# Patient Record
Sex: Male | Born: 1945 | Race: Black or African American | Hispanic: No | Smoking: Former smoker
Health system: Southern US, Community
[De-identification: ages and names within clinical notes are randomized; demographics above are authoritative.]

## PROBLEM LIST (undated history)

## (undated) DIAGNOSIS — N529 Male erectile dysfunction, unspecified: Secondary | ICD-10-CM

## (undated) DIAGNOSIS — J449 Chronic obstructive pulmonary disease, unspecified: Secondary | ICD-10-CM

## (undated) HISTORY — PX: KNEE SURGERY: SHX244

---

## 2005-12-02 ENCOUNTER — Emergency Department: Payer: Self-pay | Admitting: Internal Medicine

## 2008-06-06 ENCOUNTER — Emergency Department: Payer: Self-pay | Admitting: Emergency Medicine

## 2011-08-02 ENCOUNTER — Emergency Department: Payer: Self-pay | Admitting: Emergency Medicine

## 2011-08-02 LAB — BASIC METABOLIC PANEL
Anion Gap: 15 (ref 7–16)
Calcium, Total: 9.5 mg/dL (ref 8.5–10.1)
Creatinine: 1.08 mg/dL (ref 0.60–1.30)
EGFR (Non-African Amer.): >60
Glucose: 135 mg/dL — ABNORMAL HIGH (ref 65–99)
Osmolality: 276 (ref 275–301)
Potassium: 4.2 mmol/L (ref 3.5–5.1)
Sodium: 136 mmol/L (ref 136–145)

## 2011-08-02 LAB — URINALYSIS, COMPLETE
Bacteria: NONE SEEN
Bilirubin,UR: NEGATIVE
Blood: NEGATIVE
Nitrite: NEGATIVE
Protein: 30
RBC,UR: 1 /HPF (ref 0–5)
Specific Gravity: 1.026 (ref 1.003–1.030)
WBC UR: 8 /HPF (ref 0–5)

## 2011-08-02 LAB — CBC
HCT: 47.6 % (ref 40.0–52.0)
HGB: 15.5 g/dL (ref 13.0–18.0)
MCHC: 32.5 g/dL (ref 32.0–36.0)

## 2013-10-16 ENCOUNTER — Emergency Department: Payer: Self-pay | Admitting: Emergency Medicine

## 2013-11-01 ENCOUNTER — Emergency Department: Payer: Self-pay | Admitting: Emergency Medicine

## 2014-07-02 ENCOUNTER — Emergency Department: Payer: Self-pay | Admitting: Emergency Medicine

## 2016-02-25 ENCOUNTER — Emergency Department: Payer: Medicare Other

## 2016-02-25 ENCOUNTER — Emergency Department
Admission: EM | Admit: 2016-02-25 | Discharge: 2016-02-25 | Disposition: A | Discharge status: Home / Self Care | Payer: Medicare Other | Attending: Emergency Medicine | Admitting: Emergency Medicine

## 2016-02-25 ENCOUNTER — Encounter: Payer: Self-pay | Admitting: Emergency Medicine

## 2016-02-25 DIAGNOSIS — Z87891 Personal history of nicotine dependence: Secondary | ICD-10-CM | POA: Diagnosis not present

## 2016-02-25 DIAGNOSIS — J441 Chronic obstructive pulmonary disease with (acute) exacerbation: Secondary | ICD-10-CM | POA: Diagnosis not present

## 2016-02-25 DIAGNOSIS — R0602 Shortness of breath: Secondary | ICD-10-CM | POA: Diagnosis present

## 2016-02-25 HISTORY — DX: Chronic obstructive pulmonary disease, unspecified: J44.9

## 2016-02-25 HISTORY — DX: Male erectile dysfunction, unspecified: N52.9

## 2016-02-25 LAB — COMPREHENSIVE METABOLIC PANEL
ALT: 11 U/L — AB (ref 17–63)
AST: 34 U/L (ref 15–41)
Albumin: 3.8 g/dL (ref 3.5–5.0)
Alkaline Phosphatase: 51 U/L (ref 38–126)
Anion gap: 7 (ref 5–15)
BILIRUBIN TOTAL: 0.2 mg/dL — AB (ref 0.3–1.2)
BUN: 16 mg/dL (ref 6–20)
CHLORIDE: 104 mmol/L (ref 101–111)
CO2: 29 mmol/L (ref 22–32)
CREATININE: 0.78 mg/dL (ref 0.61–1.24)
Calcium: 9.6 mg/dL (ref 8.9–10.3)
Glucose, Bld: 118 mg/dL — ABNORMAL HIGH (ref 65–99)
POTASSIUM: 3.5 mmol/L (ref 3.5–5.1)
Sodium: 140 mmol/L (ref 135–145)
TOTAL PROTEIN: 7.7 g/dL (ref 6.5–8.1)

## 2016-02-25 LAB — CBC
HEMATOCRIT: 36.1 % — AB (ref 40.0–52.0)
Hemoglobin: 11.3 g/dL — ABNORMAL LOW (ref 13.0–18.0)
MCH: 24.9 pg — AB (ref 26.0–34.0)
MCHC: 31.3 g/dL — ABNORMAL LOW (ref 32.0–36.0)
MCV: 79.5 fL — AB (ref 80.0–100.0)
PLATELETS: 206 10*3/uL (ref 150–440)
RBC: 4.54 MIL/uL (ref 4.40–5.90)
RDW: 20.7 % — AB (ref 11.5–14.5)
WBC: 6.2 10*3/uL (ref 3.8–10.6)

## 2016-02-25 LAB — TROPONIN I

## 2016-02-25 MED ORDER — IPRATROPIUM-ALBUTEROL 0.5-2.5 (3) MG/3ML IN SOLN
3.0000 mL | Freq: Once | RESPIRATORY_TRACT | Status: AC
  Administered 2016-02-25: 3 mL via RESPIRATORY_TRACT
  Filled 2016-02-25: qty 3

## 2016-02-25 MED ORDER — PREDNISONE 50 MG PO TABS: 50.0000 mg | ORAL_TABLET | Freq: Every day | ORAL | 0 refills | Status: DC

## 2016-02-25 MED ORDER — METHYLPREDNISOLONE SODIUM SUCC 125 MG IJ SOLR
125.0000 mg | Freq: Once | INTRAMUSCULAR | Status: AC
  Administered 2016-02-25: 125 mg via INTRAVENOUS
  Filled 2016-02-25: qty 2

## 2016-02-25 NOTE — ED Provider Notes (Signed)
[facility] Emergency Department Provider Note   ____________________________________________    I have reviewed the triage vital signs and the nursing notes.   HISTORY  Chief Complaint Shortness of Breath and Wheezing     HPI [name] [name] is a 70 y.o. male who presents with complaints of shortness of breath. Patient reports his breathing has worsened over the last several days, he does report a history of COPD. EMS reports he had significant wheezing upon arrival and they gave a DuoNeb which helped tremendously. Patient reports he feels much better. He denies chest pain. No palpations. No recent travel or calf pain. No fevers or chills.   Past Medical History:  Diagnosis Date  . COPD (chronic obstructive pulmonary disease) (HCC)   . Male erectile disorder     There are no active problems to display for this patient.   No past surgical history on file.  Prior to Admission medications   Not on File     Allergies Patient has no known allergies.  No family history on file.  Social History Social History  Substance Use Topics  . Smoking status: Former Games developermoker  . Smokeless tobacco: Never Used  . Alcohol use No    Review of Systems  Constitutional: No fever/chills  ENT: No Throat swelling Cardiovascular: Denies chest pain. Respiratory: As above Gastrointestinal: No abdominal pain.    Musculoskeletal: Negative for back pain. Skin: Negative for rash. Neurological: Negative for headaches  10-point ROS otherwise negative.  ____________________________________________   PHYSICAL EXAM:  VITAL SIGNS: ED Triage Vitals  Enc Vitals Group     BP 02/25/16 1950 122/75     Pulse Rate 02/25/16 1950 91     Resp 02/25/16 1950 (!) 23     Temp --      Temp Source 02/25/16 1950 Oral     SpO2 02/25/16 1950 100 %     Weight 02/25/16 1952 126 lb (57.2 kg)     Height 02/25/16 1952 5\' 8"  (1.727 m)     Head Circumference --      Peak  Flow --      Pain Score --      Pain Loc --      Pain Edu? --      Excl. in GC? --     Constitutional: Alert and oriented. No acute distress. Pleasant and interactive Eyes: Conjunctivae are normal.  Head: Atraumatic. Nose: No congestion/rhinnorhea. Mouth/Throat: Mucous membranes are moist.    Cardiovascular: Normal rate, regular rhythm. Grossly normal heart sounds.  Good peripheral circulation. Respiratory: Increased history effort.  No retractions. No wheezes Gastrointestinal: Soft and nontender. No distention.  No CVA tenderness. Genitourinary: deferred Musculoskeletal: No lower extremity tenderness nor edema.  Warm and well perfused Neurologic:  Normal speech and language. No gross focal neurologic deficits are appreciated.  Skin:  Skin is warm, dry and intact. No rash noted. Psychiatric: Mood and affect are normal. Speech and behavior are normal.  ____________________________________________   LABS (all labs ordered are listed, but only abnormal results are displayed)  Labs Reviewed  CBC - Abnormal; Notable for the following:       Result Value   Hemoglobin 11.3 (*)    HCT 36.1 (*)    MCV 79.5 (*)    MCH 24.9 (*)    MCHC 31.3 (*)    RDW 20.7 (*)    All other components within normal limits  COMPREHENSIVE METABOLIC PANEL  TROPONIN I   ____________________________________________  EKG  ED ECG REPORT I, Jene EveryKINNER, Sylis Ketchum, the attending physician, personally viewed and interpreted this ECG.  Date: 02/25/2016  Rhythm: A flutter QRS Axis: normal Intervals: normal ST/T Wave abnormalities: normal Conduction Disturbances: none Narrative Interpretation: unremarkable  ____________________________________________  RADIOLOGY  Chest x-ray unremarkable ____________________________________________   PROCEDURES  Procedure(s) performed: No    Critical Care performed: No ____________________________________________   INITIAL IMPRESSION / ASSESSMENT AND PLAN /  ED COURSE  Pertinent labs & imaging results that were available during my care of the patient were reviewed by me and considered in my medical decision making (see chart for details).  Patient improved significantly after DuoNeb given by EMS, we we'll give IV site Medrol and additional nebulizers and check labs and EKG and chest x-ray  Clinical Course   ----------------------------------------- 9:57 PM on 02/25/2016 -----------------------------------------  Patient is asymptomatic and feels "great". Lungs are clear to auscultation, laboratory unremarkable. Incidental finding of a flutter on EKG patient to [facility], we'll discharge with prednisone, return precautions discussed ____________________________________________   FINAL CLINICAL IMPRESSION(S) / ED DIAGNOSES  Final diagnoses:  COPD exacerbation (HCC)      NEW MEDICATIONS STARTED DURING THIS VISIT:  New Prescriptions   No medications on file     Note:  This document was prepared using Dragon voice recognition software and may include unintentional dictation errors.    Jene Everyobert Jonuel Butterfield, MD 02/25/16 2158

## 2016-02-25 NOTE — ED Triage Notes (Signed)
Pt presents to ED from his daughters house by EMS with c/o sob, wheezing, and occasional cough. Pt states they [facility] at [facility] and was told he didn't need them anymore and he has felt sob since then. Duoneb provided by EMS . Initial O2 SAT was 91%; 100% post breathing tx. Pt currently has no increased work of breathing noted. Skin warm and dry. Denies chest pain. Hx of COPD.

## 2016-05-22 ENCOUNTER — Emergency Department
Admission: EM | Admit: 2016-05-22 | Discharge: 2016-05-22 | Disposition: A | Discharge status: Home / Self Care | Payer: Medicare Other | Attending: Emergency Medicine | Admitting: Emergency Medicine

## 2016-05-22 ENCOUNTER — Emergency Department: Payer: Medicare Other

## 2016-05-22 ENCOUNTER — Encounter: Payer: Self-pay | Admitting: Emergency Medicine

## 2016-05-22 DIAGNOSIS — Z87891 Personal history of nicotine dependence: Secondary | ICD-10-CM | POA: Insufficient documentation

## 2016-05-22 DIAGNOSIS — R062 Wheezing: Secondary | ICD-10-CM | POA: Diagnosis present

## 2016-05-22 DIAGNOSIS — J441 Chronic obstructive pulmonary disease with (acute) exacerbation: Secondary | ICD-10-CM

## 2016-05-22 LAB — CBC WITH DIFFERENTIAL/PLATELET
BASOS ABS: 0.1 10*3/uL (ref 0–0.1)
BASOS PCT: 1 %
EOS ABS: 0.1 10*3/uL (ref 0–0.7)
EOS PCT: 1 %
HCT: 30.6 % — ABNORMAL LOW (ref 40.0–52.0)
Hemoglobin: 9.5 g/dL — ABNORMAL LOW (ref 13.0–18.0)
LYMPHS PCT: 25 %
Lymphs Abs: 2 10*3/uL (ref 1.0–3.6)
MCH: 24 pg — ABNORMAL LOW (ref 26.0–34.0)
MCHC: 30.9 g/dL — ABNORMAL LOW (ref 32.0–36.0)
MCV: 77.7 fL — ABNORMAL LOW (ref 80.0–100.0)
MONO ABS: 0.8 10*3/uL (ref 0.2–1.0)
Monocytes Relative: 11 %
Neutro Abs: 4.8 10*3/uL (ref 1.4–6.5)
Neutrophils Relative %: 62 %
PLATELETS: 431 10*3/uL (ref 150–440)
RBC: 3.94 MIL/uL — AB (ref 4.40–5.90)
RDW: 18.8 % — AB (ref 11.5–14.5)
WBC: 7.7 10*3/uL (ref 3.8–10.6)

## 2016-05-22 LAB — BASIC METABOLIC PANEL
ANION GAP: 7 (ref 5–15)
BUN: 14 mg/dL (ref 6–20)
CALCIUM: 9.9 mg/dL (ref 8.9–10.3)
CO2: 30 mmol/L (ref 22–32)
Chloride: 105 mmol/L (ref 101–111)
Creatinine, Ser: 0.55 mg/dL — ABNORMAL LOW (ref 0.61–1.24)
GFR calc Af Amer: >60 mL/min (ref >60)
Glucose, Bld: 98 mg/dL (ref 65–99)
POTASSIUM: 3.5 mmol/L (ref 3.5–5.1)
SODIUM: 142 mmol/L (ref 135–145)

## 2016-05-22 LAB — INFLUENZA PANEL BY PCR (TYPE A & B)
INFLBPCR: NEGATIVE
Influenza A By PCR: NEGATIVE

## 2016-05-22 LAB — TROPONIN I

## 2016-05-22 MED ORDER — IPRATROPIUM-ALBUTEROL 0.5-2.5 (3) MG/3ML IN SOLN
3.0000 mL | Freq: Once | RESPIRATORY_TRACT | Status: AC
  Administered 2016-05-22: 3 mL via RESPIRATORY_TRACT
  Filled 2016-05-22: qty 3

## 2016-05-22 MED ORDER — PREDNISONE 20 MG PO TABS: 60.0000 mg | ORAL_TABLET | Freq: Every day | ORAL | 0 refills | Status: AC

## 2016-05-22 MED ORDER — ALBUTEROL SULFATE HFA 108 (90 BASE) MCG/ACT IN AERS: 2.0000 | INHALATION_SPRAY | Freq: Four times a day (QID) | RESPIRATORY_TRACT | 2 refills | Status: DC | PRN

## 2016-05-22 MED ORDER — METHYLPREDNISOLONE SODIUM SUCC 125 MG IJ SOLR
125.0000 mg | Freq: Once | INTRAMUSCULAR | Status: AC
  Administered 2016-05-22: 125 mg via INTRAVENOUS
  Filled 2016-05-22: qty 2

## 2016-05-22 NOTE — ED Provider Notes (Signed)
[facility] Emergency Department Provider Note  ____________________________________________  Time seen: Approximately 6:02 PM  I have reviewed the triage vital signs and the nursing notes.   HISTORY  Chief Complaint Wheezing   HPI [name] [name] is a 71 y.o. male the history of COPD who presents for evaluation of wheezing. Patient reports for the last few days he has had URI symptoms including cough productive of yellow sputum, shortness of breath, wheezing, fever, chills. Today his landlord went to see him and noticed that he was having difficulty breathing so called the ambulance. He was wheezing per EMS and received 1 DuoNeb treatment in route. Patient reports that he hasn't been using his inhalers because he cannot afford it. He is no longer smoking. Patient denies chest pain, vomiting or diarrhea. He reports that his shortness of breath is now markedly improved after receiving 1 DuoNeb treatment.  Past Medical History:  Diagnosis Date  . COPD (chronic obstructive pulmonary disease) (HCC)   . Male erectile disorder     There are no active problems to display for this patient.   History reviewed. No pertinent surgical history.  Prior to Admission medications   Medication Sig Start Date End Date Taking? Authorizing Provider  albuterol (PROVENTIL HFA;VENTOLIN HFA) 108 (90 Base) MCG/ACT inhaler Inhale 2 puffs into the lungs every 6 (six) hours as needed for wheezing or shortness of breath. 05/22/16   Nita Sickle, MD  predniSONE (DELTASONE) 20 MG tablet Take 3 tablets (60 mg total) by mouth daily. 05/22/16 05/26/16  Nita Sickle, MD    Allergies Patient has no known allergies.  History reviewed. No pertinent family history.  Social History Social History  Substance Use Topics  . Smoking status: Former Games developer  . Smokeless tobacco: Never Used  . Alcohol use No    Review of Systems  Constitutional: +fever and chills Eyes: Negative for  visual changes. ENT: Negative for sore throat. Neck: No neck pain  Cardiovascular: Negative for chest pain. Respiratory: + shortness of breath, cough and wheezing Gastrointestinal: Negative for abdominal pain, vomiting or diarrhea. Genitourinary: Negative for dysuria. Musculoskeletal: Negative for back pain. Skin: Negative for rash. Neurological: Negative for headaches, weakness or numbness. Psych: No SI or HI  ____________________________________________   PHYSICAL EXAM:  VITAL SIGNS: ED Triage Vitals  Enc Vitals Group     BP 05/22/16 1710 (!) 149/109     Pulse Rate 05/22/16 1710 96     Resp 05/22/16 1710 18     Temp 05/22/16 1710 97.7 F (36.5 C)     Temp Source 05/22/16 1710 Oral     SpO2 05/22/16 1710 99 %     Weight 05/22/16 1714 128 lb (58.1 kg)     Height 05/22/16 1714 5\' 8"  (1.727 m)     Head Circumference --      Peak Flow --      Pain Score --      Pain Loc --      Pain Edu? --      Excl. in GC? --     Constitutional: Alert and oriented. Well appearing and in no apparent distress. HEENT:      Head: Normocephalic and atraumatic.         Eyes: Conjunctivae are normal. Sclera is non-icteric. EOMI. PERRL      Mouth/Throat: Mucous membranes are moist.       Neck: Supple with no signs of meningismus. Cardiovascular: Regular rate and rhythm. No murmurs, gallops, or rubs. 2+ symmetrical  distal pulses are present in all extremities. No JVD. Respiratory: Normal respiratory effort, normal sats, decreased air movement bilaterally with faint expiratory wheeze Gastrointestinal: Soft, non tender, and non distended with positive bowel sounds. No rebound or guarding. Musculoskeletal: Nontender with normal range of motion in all extremities. No edema, cyanosis, or erythema of extremities. Neurologic: Normal speech and language. Face is symmetric. Moving all extremities. No gross focal neurologic deficits are appreciated. Skin: Skin is warm, dry and intact. No rash  noted. Psychiatric: Mood and affect are normal. Speech and behavior are normal.  ____________________________________________   LABS (all labs ordered are listed, but only abnormal results are displayed)  Labs Reviewed  CBC WITH DIFFERENTIAL/PLATELET - Abnormal; Notable for the following:       Result Value   RBC 3.94 (*)    Hemoglobin 9.5 (*)    HCT 30.6 (*)    MCV 77.7 (*)    MCH 24.0 (*)    MCHC 30.9 (*)    RDW 18.8 (*)    All other components within normal limits  BASIC METABOLIC PANEL - Abnormal; Notable for the following:    Creatinine, Ser 0.55 (*)    All other components within normal limits  TROPONIN I  INFLUENZA PANEL BY PCR (TYPE A & B)   ____________________________________________  EKG  ED ECG REPORT I, Nita Sicklearolina Etheline Geppert, the attending physician, personally viewed and interpreted this ECG.  Normal sinus rhythm, rate of 79, normal intervals, right axis deviation, no ST elevations or depressions.  ____________________________________________  RADIOLOGY  CXR: Negative ____________________________________________   PROCEDURES  Procedure(s) performed: None Procedures Critical Care performed:  None ____________________________________________   INITIAL IMPRESSION / ASSESSMENT AND PLAN / ED COURSE  71 y.o. male the history of COPD who presents for evaluation of wheezing and SOb in the setting of 2 days of URI symptoms. Patient has normal work of breathing, normal sats, decreased air movement with faint expiratory wheezes. We'll give DuoNeb 2, Solu-Medrol, check for the flu, and chest x-ray to rule out pneumonia.  Clinical Course as of May 22 1944  Sat May 22, 2016  1902 Hgb dropped to 2 points since 02/2016 and is currently 9.5 with low MCV concerning for possible iron deficiency anemia. Rectal exam showing brown stool guaiac negative.  [CV]  1925 Flu negative, chest x-ray with no evidence of pneumonia. Blood work but no other acute findings. Patient  feels markedly improved after 2 DuoNeb treatments. We'll discharge home with albuterol, prednisone, and [facility]  [CV]    Clinical Course User Index [CV] Nita Sicklearolina Chalmers Iddings, MD    Pertinent labs & imaging results that were available during my care of the patient were reviewed by me and considered in my medical decision making (see chart for details).    ____________________________________________   FINAL CLINICAL IMPRESSION(S) / ED DIAGNOSES  Final diagnoses:  COPD exacerbation (HCC)      NEW MEDICATIONS STARTED DURING THIS VISIT:  New Prescriptions   ALBUTEROL (PROVENTIL HFA;VENTOLIN HFA) 108 (90 BASE) MCG/ACT INHALER    Inhale 2 puffs into the lungs every 6 (six) hours as needed for wheezing or shortness of breath.   PREDNISONE (DELTASONE) 20 MG TABLET    Take 3 tablets (60 mg total) by mouth daily.     Note:  This document was prepared using Dragon voice recognition software and may include unintentional dictation errors.    Nita Sicklearolina Enijah Furr, MD 05/22/16 2042

## 2016-05-22 NOTE — ED Notes (Signed)
Pt not interested in staying here in the ed to speak with sw. States he has a fireplace at home and has been using that to keep warm. Given a food tray and milk.

## 2016-05-22 NOTE — ED Notes (Signed)
Asked pt again how long heat has been out, since as the pt talking he's been out of a job and no insurance for "awhile". Pt now states no heat x 2 days. Pt angry that he has to pay for electric when he is out of a job.

## 2016-05-22 NOTE — ED Notes (Signed)
Pt d/c'd but has no heat or electricity at home so will be staying overnight in the er until he can talk to sw in the am. Iv d/c'd. Pt off monitor and moved to reciner in the hallway

## 2016-05-22 NOTE — ED Triage Notes (Addendum)
Wheezing - poor historian. Landlord call ems for his wheezing - pt has no phone, no heat. Per ems pt stated without heat x 2 weeks.

## 2016-05-22 NOTE — ED Notes (Signed)
States out of job, so hasn't been using his inhaler because it costs $10.00. [facility]

## 2016-05-22 NOTE — ED Notes (Signed)
Pt declined to stay - spoke with charge nurse and she set up a care package for pt to take back to his house and gave him a cab voucher

## 2016-05-30 ENCOUNTER — Telehealth: Payer: Self-pay | Admitting: Emergency Medicine

## 2016-05-30 NOTE — Telephone Encounter (Signed)
EMS requesting transport form filled out, confirmed by chart documentation that the pt arrived by EMS and evaluated here., formed signed by this RN

## 2016-10-12 ENCOUNTER — Other Ambulatory Visit: Payer: Self-pay

## 2016-11-03 NOTE — Progress Notes (Signed)
11/04/2016 2:10 PM   [name] [name] [birth year] [id number]  Referring provider: [facility], [facility] [address] [place], [place] [address]  Chief Complaint  Patient presents with  . New Patient (Initial Visit)    urinary incontinence / elevated psa referred by [facility] [place])     HPI: Patient is a 71 year old African-American male who is referred by Dr. Judie Grieve for elevated PSA with his peer support counselor, Lacretia Nicks.  Patient is a poor historian and is difficult to understand.    Patient was found to have a PSA of 4.9 in 09/2016.  No previous PSA's available at this time.    BPH WITH LUTS  (prostate and/or bladder) His IPSS score today is 10, which is moderate lower urinary tract symptomatology. He is mostly dissatisfied with his quality life due to his urinary symptoms. His PVR is 80 mL.      His major complaints today are frequency, urgency, nocturia, incontinence and intermittency.  He has these symptoms when he drinks alcohol.  He denies any dysuria, hematuria or suprapubic pain.   He also denies any recent fevers, chills, nausea or vomiting.  He does not have a family history of PCa.      IPSS    Row Name 11/04/16 1300         International Prostate Symptom Score   How often have you had the sensation of not emptying your bladder? Not at All     How often have you had to urinate less than every two hours? Less than half the time     How often have you found you stopped and started again several times when you urinated? Less than half the time     How often have you found it difficult to postpone urination? Less than half the time     How often have you had a weak urinary stream? Not at All     How often have you had to strain to start urination? Not at All     How many times did you typically get up at night to urinate? 4 Times     Total IPSS Score 10       Quality of Life due to urinary symptoms   If you were to spend the rest of your  life with your urinary condition just the way it is now how would you feel about that? Mostly Disatisfied        Score:  1-7 Mild 8-19 Moderate 20-35 Severe   Erectile dysfunction His SHIM score is 25, which is no ED.   He has been having difficulty with erections when he drinks.  His libido is preserved.   His risk factors for ED are age, HTN, alcohol abuse and smoking.  He denies any painful erections or curvatures with his erections.        SHIM    Row Name 11/04/16 1343         SHIM: Over the last 6 months:   How do you rate your confidence that you could get and keep an erection? Very High     When you had erections with sexual stimulation, how often were your erections hard enough for penetration (entering your partner)? Almost Always or Always     During sexual intercourse, how often were you able to maintain your erection after you had penetrated (entered) your partner? Almost Always or Always     During sexual intercourse, how difficult was it to maintain  your erection to completion of intercourse? Not Difficult     When you attempted sexual intercourse, how often was it satisfactory for you? Almost Always or Always       SHIM Total Score   SHIM 25        Score: 1-7 Severe ED 8-11 Moderate ED 12-16 Mild-Moderate ED 17-21 Mild ED 22-25 No ED   PMH: Past Medical History:  Diagnosis Date  . COPD (chronic obstructive pulmonary disease) (HCC)   . Male erectile disorder     Surgical History: Past Surgical History:  Procedure Laterality Date  . KNEE SURGERY Right     Home Medications:  Allergies as of 11/04/2016   No Known Allergies     Medication List       Accurate as of 11/04/16  2:10 PM. Always use your most recent med list.          albuterol 108 (90 Base) MCG/ACT inhaler Commonly known as:  PROVENTIL HFA;VENTOLIN HFA Inhale 2 puffs into the lungs every 6 (six) hours as needed for wheezing or shortness of breath.       Allergies: No Known  Allergies  Family History: No family history on file.  Social History:  reports that he has quit smoking. He has never used smokeless tobacco. He reports that he does not drink alcohol or use drugs.  ROS: UROLOGY Frequent Urination?: Yes Hard to postpone urination?: Yes Burning/pain with urination?: No Get up at night to urinate?: Yes Leakage of urine?: Yes Urine stream starts and stops?: Yes Trouble starting stream?: No Do you have to strain to urinate?: No Blood in urine?: Yes Urinary tract infection?: No Sexually transmitted disease?: No Injury to kidneys or bladder?: No Painful intercourse?: No Weak stream?: No Erection problems?: No Penile pain?: No  Gastrointestinal Nausea?: No Vomiting?: No Indigestion/heartburn?: No Diarrhea?: No Constipation?: Yes  Constitutional Fever: Yes Night sweats?: Yes Weight loss?: Yes Fatigue?: No  Skin Skin rash/lesions?: No Itching?: Yes  Eyes Blurred vision?: Yes Double vision?: No  Ears/Nose/Throat Sore throat?: Yes Sinus problems?: No  Hematologic/Lymphatic Swollen glands?: Yes Easy bruising?: Yes  Cardiovascular Leg swelling?: Yes Chest pain?: No  Respiratory Cough?: Yes Shortness of breath?: Yes  Endocrine Excessive thirst?: Yes  Musculoskeletal Back pain?: Yes Joint pain?: Yes  Neurological Headaches?: Yes Dizziness?: Yes  Psychologic Depression?: Yes Anxiety?: Yes  Physical Exam: BP (!) 149/79   Pulse 60   Ht 5\' 8"  (1.727 m)   Wt 132 lb (59.9 kg)   BMI 20.07 kg/m   Constitutional: Well nourished. Alert and oriented, No acute distress. HEENT: [place] AT, moist mucus membranes. Trachea midline, no masses. Cardiovascular: No clubbing, cyanosis, or edema. Respiratory: Normal respiratory effort, no increased work of breathing. Skin: No rashes, bruises or suspicious lesions. Lymph: No cervical or inguinal adenopathy. Neurologic: Grossly intact, no focal deficits, moving all 4  extremities. Psychiatric: Normal mood and affect.  Laboratory Data: Lab Results  Component Value Date   WBC 7.7 05/22/2016   HGB 9.5 (L) 05/22/2016   HCT 30.6 (L) 05/22/2016   MCV 77.7 (L) 05/22/2016   PLT 431 05/22/2016    Lab Results  Component Value Date   CREATININE 0.55 (L) 05/22/2016    Lab Results  Component Value Date   AST 34 02/25/2016   Lab Results  Component Value Date   ALT 11 (L) 02/25/2016   I have independently reviewed the labs.  Pertinent Imaging: Results for [name] MenghiniJACKSON, [name] [name] (MRN [id number]) as of 11/04/2016 14:29  Ref.  Range 11/04/2016 14:01  Scan Result Unknown 80   I have independently reviewed the films.    Assessment & Plan:    1. Elevated PSA  - I discussed with the patient that PSA is an acronym for  prostate specific antigen,  which is a protein made by the prostate gland and can be detected in the blood stream. I explained to the patient situations that would increase the PSA, such as: a man's age,  BPH, infection, recent intercourse/ejaculation, prostate infarction, recent urethroscopic manipulation (Foley placement/cystoscopy) and prostate cancer.   - We discussed that indications for prostate biopsy are defined by age and race specific PSA cutoffs as well as a PSA velocity of 0.75/year - 64 to 70 years of age the range is 0 to 6.5             - I discussed the AUA Guideline's (2013) for men aged 70+ years or any man with less than a 10 to 15 year life expectancy that screening is not recommended.  If the individual is in excellent health and after discussion it is decided to do a screening PSA, the threshold for biopsy should be raised to 10 ng/mL and if the PSA returns below 3 ng/mL, discontinue screening.             - I did explain that at his age and with his co-morbid conditions, if prostate cancer is found his treatment would be palliative and not curative             - His questions where answered and he voiced his understanding              - He will RTC for a male exam  2. Incontinence  - patient is managing with incontinence pads - states he has no issues when he does not drink alcohol   Return for reschedule with a male provider for exam.  These notes generated with voice recognition software. I apologize for typographical errors.  Michiel Cowboy, PA-C  [facility] [address], [address] [place], [place] [address] [phone]

## 2016-11-04 ENCOUNTER — Telehealth: Payer: Self-pay | Admitting: Urology

## 2016-11-04 ENCOUNTER — Encounter: Payer: Self-pay | Admitting: Urology

## 2016-11-04 ENCOUNTER — Ambulatory Visit (INDEPENDENT_AMBULATORY_CARE_PROVIDER_SITE_OTHER): Payer: Medicare Other | Admitting: Urology

## 2016-11-04 VITALS — BP 149/79 | HR 60 | Ht 68.0 in | Wt 132.0 lb

## 2016-11-04 DIAGNOSIS — R972 Elevated prostate specific antigen [PSA]: Secondary | ICD-10-CM | POA: Diagnosis not present

## 2016-11-04 DIAGNOSIS — N39498 Other specified urinary incontinence: Secondary | ICD-10-CM | POA: Diagnosis not present

## 2016-11-04 LAB — BLADDER SCAN AMB NON-IMAGING: Scan Result: 80

## 2016-11-04 NOTE — Telephone Encounter (Signed)
Please send my note to [facility].

## 2016-11-04 NOTE — Telephone Encounter (Signed)
done

## 2016-11-23 ENCOUNTER — Ambulatory Visit: Payer: Medicaid Other | Admitting: Urology

## 2017-02-08 ENCOUNTER — Inpatient Hospital Stay: Payer: Medicare Other

## 2017-02-08 ENCOUNTER — Encounter: Payer: Self-pay | Admitting: Emergency Medicine

## 2017-02-08 ENCOUNTER — Emergency Department: Payer: Medicare Other

## 2017-02-08 ENCOUNTER — Inpatient Hospital Stay
Admission: EM | Admit: 2017-02-08 | Discharge: 2017-02-11 | DRG: 871 | Disposition: A | Discharge status: Skilled Nursing Facility | Payer: Medicare Other | Attending: Specialist | Admitting: Specialist

## 2017-02-08 DIAGNOSIS — J449 Chronic obstructive pulmonary disease, unspecified: Secondary | ICD-10-CM | POA: Diagnosis present

## 2017-02-08 DIAGNOSIS — R627 Adult failure to thrive: Secondary | ICD-10-CM | POA: Diagnosis present

## 2017-02-08 DIAGNOSIS — N390 Urinary tract infection, site not specified: Secondary | ICD-10-CM

## 2017-02-08 DIAGNOSIS — G9341 Metabolic encephalopathy: Secondary | ICD-10-CM | POA: Diagnosis present

## 2017-02-08 DIAGNOSIS — F1020 Alcohol dependence, uncomplicated: Secondary | ICD-10-CM | POA: Diagnosis present

## 2017-02-08 DIAGNOSIS — Z8249 Family history of ischemic heart disease and other diseases of the circulatory system: Secondary | ICD-10-CM | POA: Diagnosis not present

## 2017-02-08 DIAGNOSIS — E872 Acidosis: Secondary | ICD-10-CM | POA: Diagnosis present

## 2017-02-08 DIAGNOSIS — N3000 Acute cystitis without hematuria: Secondary | ICD-10-CM

## 2017-02-08 DIAGNOSIS — Z681 Body mass index (BMI) 19 or less, adult: Secondary | ICD-10-CM

## 2017-02-08 DIAGNOSIS — E860 Dehydration: Secondary | ICD-10-CM | POA: Diagnosis present

## 2017-02-08 DIAGNOSIS — R6520 Severe sepsis without septic shock: Secondary | ICD-10-CM

## 2017-02-08 DIAGNOSIS — J441 Chronic obstructive pulmonary disease with (acute) exacerbation: Secondary | ICD-10-CM

## 2017-02-08 DIAGNOSIS — F17210 Nicotine dependence, cigarettes, uncomplicated: Secondary | ICD-10-CM | POA: Diagnosis present

## 2017-02-08 DIAGNOSIS — I10 Essential (primary) hypertension: Secondary | ICD-10-CM | POA: Diagnosis present

## 2017-02-08 DIAGNOSIS — A419 Sepsis, unspecified organism: Secondary | ICD-10-CM | POA: Diagnosis not present

## 2017-02-08 DIAGNOSIS — E43 Unspecified severe protein-calorie malnutrition: Secondary | ICD-10-CM | POA: Diagnosis present

## 2017-02-08 DIAGNOSIS — E512 Wernicke's encephalopathy: Secondary | ICD-10-CM | POA: Diagnosis present

## 2017-02-08 DIAGNOSIS — E8729 Other acidosis: Secondary | ICD-10-CM

## 2017-02-08 DIAGNOSIS — B961 Klebsiella pneumoniae [K. pneumoniae] as the cause of diseases classified elsewhere: Secondary | ICD-10-CM | POA: Diagnosis present

## 2017-02-08 DIAGNOSIS — R0602 Shortness of breath: Secondary | ICD-10-CM | POA: Diagnosis present

## 2017-02-08 DIAGNOSIS — R410 Disorientation, unspecified: Secondary | ICD-10-CM

## 2017-02-08 DIAGNOSIS — S0590XA Unspecified injury of unspecified eye and orbit, initial encounter: Secondary | ICD-10-CM

## 2017-02-08 HISTORY — DX: Severe sepsis without septic shock: R65.20

## 2017-02-08 HISTORY — DX: Sepsis, unspecified organism: A41.9

## 2017-02-08 LAB — AMMONIA: AMMONIA: 9 umol/L (ref 9–35)

## 2017-02-08 LAB — URINALYSIS, COMPLETE (UACMP) WITH MICROSCOPIC
Glucose, UA: 50 mg/dL — AB
KETONES UR: 20 mg/dL — AB
Leukocytes, UA: NEGATIVE
Nitrite: NEGATIVE
Specific Gravity, Urine: 1.027 (ref 1.005–1.030)
pH: 5 (ref 5.0–8.0)

## 2017-02-08 LAB — URINE DRUG SCREEN, QUALITATIVE (ARMC ONLY)
Amphetamines, Ur Screen: NEGATIVE — AB
BARBITURATES, UR SCREEN: NEGATIVE — AB
Benzodiazepine, Ur Scrn: NEGATIVE — AB
CANNABINOID 50 NG, UR ~~LOC~~: NEGATIVE — AB
Cocaine Metabolite,Ur ~~LOC~~: NEGATIVE — AB
MDMA (ECSTASY) UR SCREEN: NEGATIVE — AB
Methadone Scn, Ur: NEGATIVE — AB
Opiate, Ur Screen: NEGATIVE — AB
Phencyclidine (PCP) Ur S: NEGATIVE — AB
TRICYCLIC, UR SCREEN: NEGATIVE — AB

## 2017-02-08 LAB — CBC WITH DIFFERENTIAL/PLATELET
BASOS ABS: 0.1 10*3/uL (ref 0–0.1)
BASOS PCT: 0 %
EOS ABS: 0 10*3/uL (ref 0–0.7)
Eosinophils Relative: 0 %
HCT: 43.9 % (ref 40.0–52.0)
HEMOGLOBIN: 14 g/dL (ref 13.0–18.0)
Lymphocytes Relative: 10 %
Lymphs Abs: 1.4 10*3/uL (ref 1.0–3.6)
MCH: 27.5 pg (ref 26.0–34.0)
MCHC: 32 g/dL (ref 32.0–36.0)
MCV: 86 fL (ref 80.0–100.0)
Monocytes Absolute: 1.5 10*3/uL — ABNORMAL HIGH (ref 0.2–1.0)
Monocytes Relative: 11 %
NEUTROS PCT: 79 %
Neutro Abs: 11 10*3/uL — ABNORMAL HIGH (ref 1.4–6.5)
Platelets: 200 10*3/uL (ref 150–440)
RBC: 5.11 MIL/uL (ref 4.40–5.90)
RDW: 17.2 % — ABNORMAL HIGH (ref 11.5–14.5)
WBC: 14 10*3/uL — AB (ref 3.8–10.6)

## 2017-02-08 LAB — BASIC METABOLIC PANEL
Anion gap: 18 — ABNORMAL HIGH (ref 5–15)
BUN: 18 mg/dL (ref 6–20)
CHLORIDE: 94 mmol/L — AB (ref 101–111)
CO2: 25 mmol/L (ref 22–32)
CREATININE: 1.01 mg/dL (ref 0.61–1.24)
Calcium: 9.5 mg/dL (ref 8.9–10.3)
GFR calc non Af Amer: >60 mL/min (ref >60)
Glucose, Bld: 111 mg/dL — ABNORMAL HIGH (ref 65–99)
POTASSIUM: 3.7 mmol/L (ref 3.5–5.1)
SODIUM: 137 mmol/L (ref 135–145)

## 2017-02-08 LAB — TROPONIN I: Troponin I: 0.03 ng/mL (ref <0.03)

## 2017-02-08 LAB — ETHANOL

## 2017-02-08 LAB — BRAIN NATRIURETIC PEPTIDE: B NATRIURETIC PEPTIDE 5: 54 pg/mL (ref 0.0–100.0)

## 2017-02-08 MED ORDER — THIAMINE HCL 100 MG/ML IJ SOLN
500.0000 mg | Freq: Once | INTRAMUSCULAR | Status: AC
  Administered 2017-02-08: 500 mg via INTRAVENOUS
  Filled 2017-02-08: qty 6

## 2017-02-08 MED ORDER — CEFTRIAXONE SODIUM IN DEXTROSE 20 MG/ML IV SOLN
1.0000 g | Freq: Every day | INTRAVENOUS | Status: DC
  Filled 2017-02-08: qty 50

## 2017-02-08 MED ORDER — THIAMINE HCL 100 MG/ML IJ SOLN
Freq: Once | INTRAVENOUS | Status: AC
  Administered 2017-02-08: 17:00:00 via INTRAVENOUS
  Filled 2017-02-08: qty 1000

## 2017-02-08 MED ORDER — IPRATROPIUM-ALBUTEROL 0.5-2.5 (3) MG/3ML IN SOLN
3.0000 mL | Freq: Once | RESPIRATORY_TRACT | Status: AC
  Administered 2017-02-08: 3 mL via RESPIRATORY_TRACT
  Filled 2017-02-08: qty 3

## 2017-02-08 MED ORDER — MAGNESIUM SULFATE 2 GM/50ML IV SOLN
2.0000 g | Freq: Once | INTRAVENOUS | Status: AC
  Administered 2017-02-08: 2 g via INTRAVENOUS
  Filled 2017-02-08: qty 50

## 2017-02-08 MED ORDER — SODIUM CHLORIDE 0.9 % IV SOLN
INTRAVENOUS | Status: DC
  Administered 2017-02-08 – 2017-02-10 (×3): via INTRAVENOUS

## 2017-02-08 MED ORDER — ADULT MULTIVITAMIN W/MINERALS CH
1.0000 | ORAL_TABLET | Freq: Every day | ORAL | Status: DC
  Administered 2017-02-08 – 2017-02-11 (×4): 1 via ORAL
  Filled 2017-02-08 (×4): qty 1

## 2017-02-08 MED ORDER — METHYLPREDNISOLONE SODIUM SUCC 125 MG IJ SOLR: 125.0000 mg | Freq: Once | INTRAMUSCULAR | Status: DC

## 2017-02-08 MED ORDER — FOLIC ACID 1 MG PO TABS
1.0000 mg | ORAL_TABLET | Freq: Every day | ORAL | Status: DC
  Administered 2017-02-08 – 2017-02-11 (×4): 1 mg via ORAL
  Filled 2017-02-08 (×4): qty 1

## 2017-02-08 MED ORDER — CEFTRIAXONE SODIUM IN DEXTROSE 20 MG/ML IV SOLN
1.0000 g | Freq: Once | INTRAVENOUS | Status: AC
  Administered 2017-02-08: 1 g via INTRAVENOUS
  Filled 2017-02-08: qty 50

## 2017-02-08 MED ORDER — VITAMIN B-1 100 MG PO TABS
100.0000 mg | ORAL_TABLET | Freq: Every day | ORAL | Status: DC
  Administered 2017-02-08 – 2017-02-09 (×2): 100 mg via ORAL
  Filled 2017-02-08 (×2): qty 1

## 2017-02-08 MED ORDER — LORAZEPAM 1 MG PO TABS
1.0000 mg | ORAL_TABLET | Freq: Four times a day (QID) | ORAL | Status: DC | PRN
  Administered 2017-02-11: 1 mg via ORAL
  Filled 2017-02-08: qty 1

## 2017-02-08 MED ORDER — LORAZEPAM 2 MG/ML IJ SOLN
1.0000 mg | Freq: Four times a day (QID) | INTRAMUSCULAR | Status: DC | PRN
  Administered 2017-02-09 – 2017-02-11 (×3): 1 mg via INTRAVENOUS
  Filled 2017-02-08 (×3): qty 1

## 2017-02-08 MED ORDER — THIAMINE HCL 100 MG/ML IJ SOLN
100.0000 mg | Freq: Every day | INTRAMUSCULAR | Status: DC
  Filled 2017-02-08: qty 1

## 2017-02-08 NOTE — ED Notes (Signed)
Pt's soiled diaper changed by this nurse and Lenord FellersYessica, EDT with family's permission.

## 2017-02-08 NOTE — ED Notes (Signed)
Patient transported to CT 

## 2017-02-08 NOTE — ED Provider Notes (Signed)
[facility] Emergency Department Provider Note       Time seen: ----------------------------------------- 2:10 PM on 02/08/2017 -----------------------------------------  Level V caveat: History/ROS limited by altered mental status   I have reviewed the triage vital signs and the nursing notes.   HISTORY   Chief Complaint Shortness of Breath    HPI [name] [name] is a 71 y.o. male with a history of COPD and alcohol use disorder who presents to the ED for shortness of breath. Family reportedly had not seen him in several days. He called EMS for shortness of breath and was found to be very short of breath. He doesn't history of COPD and he received a DuoNeb in route with improvement in his symptoms. According to EMS the house he was living in was cold, there was no food there, only empty beer cans.  Past Medical History:  Diagnosis Date  . COPD (chronic obstructive pulmonary disease) (HCC)   . Male erectile disorder     There are no active problems to display for this patient.   Past Surgical History:  Procedure Laterality Date  . KNEE SURGERY Right     Allergies Patient has no known allergies.  Social History Social History  Substance Use Topics  . Smoking status: Former Games developer  . Smokeless tobacco: Never Used  . Alcohol use No    Review of Systems Positive for shortness of breath  All systems negative/normal/unremarkable except as stated in the HPI  ____________________________________________   PHYSICAL EXAM:  VITAL SIGNS: ED Triage Vitals  Enc Vitals Group     BP 02/08/17 1408 (!) 136/111     Pulse Rate 02/08/17 1408 (!) 108     Resp 02/08/17 1408 16     Temp 02/08/17 1408 98.4 F (36.9 C)     Temp Source 02/08/17 1408 Oral     SpO2 02/08/17 1408 100 %     Weight 02/08/17 1409 119 lb (54 kg)     Height 02/08/17 1409 5\' 8"  (1.727 m)     Head Circumference --      Peak Flow --      Pain Score --      Pain Loc --    Pain Edu? --      Excl. in GC? --     Constitutional: Cachectic, disoriented, no distress Eyes: Conjunctivae are normal. Normal extraocular movements. ENT   Head: Normocephalic and atraumatic.   Nose: No congestion/rhinnorhea.   Mouth/Throat: Mucous membranes are moist.   Neck: No stridor. Cardiovascular: Normal rate, regular rhythm. No murmurs, rubs, or gallops. Respiratory: Normal respiratory effort without tachypnea nor retractions. Diminished breath sounds with mild wheezing Gastrointestinal: Soft and nontender. Normal bowel sounds Musculoskeletal: Nontender with normal range of motion in extremities. No lower extremity tenderness nor edema. Extremities are cool to touch Neurologic:  Normal speech and language. No gross focal neurologic deficits are appreciated.  Skin:  Skin is warm, dry and intact. No rash noted. Psychiatric: Mood and affect are normal. Patient is confused ____________________________________________  EKG: Interpreted by me. Sinus tachycardia with a rate of 107 beats per minute, PVCs, normal axis, normal QT.  ____________________________________________  ED COURSE:  Pertinent labs & imaging results that were available during my care of the patient were reviewed by me and considered in my medical decision making (see chart for details). Patient presents for shortness of breath, we will assess with labs and imaging as indicated. He will likely also need a social work consult.   Procedures  ____________________________________________   LABS (pertinent positives/negatives)  Labs Reviewed  CBC WITH DIFFERENTIAL/PLATELET - Abnormal; Notable for the following:       Result Value   WBC 14.0 (*)    RDW 17.2 (*)    Neutro Abs 11.0 (*)    Monocytes Absolute 1.5 (*)    All other components within normal limits  BASIC METABOLIC PANEL - Abnormal; Notable for the following:    Chloride 94 (*)    Glucose, Bld 111 (*)    Anion gap 18 (*)    All other  components within normal limits  TROPONIN I - Abnormal; Notable for the following:    Troponin I 0.03 (*)    All other components within normal limits  BRAIN NATRIURETIC PEPTIDE  BLOOD GAS, VENOUS  URINE DRUG SCREEN, QUALITATIVE ([facility] ONLY)  ETHANOL  AMMONIA    RADIOLOGY  Chest x-ray IMPRESSION: Emphysema without acute disease. ___________________________________________  DIFFERENTIAL DIAGNOSIS   COPD, pneumonia, anemia, alcohol use disorder, electrolyte abnormality, dehydration   FINAL ASSESSMENT AND PLAN  COPD, alcohol use disorder, confusion  Plan: Patient had presented for shortness of breath. Patients labs are still pending at this time. Patients CT of his head is still pending but his x-ray is unremarkable. There is difficult to discern as to whether his confusion is acute or chronic. Family feels like he is incapable of taking care of himself. I have consult with social work.   Emily FilbertWilliams, Jonathan E, MD   Note: This note was generated in part or whole with voice recognition software. Voice recognition is usually quite accurate but there are transcription errors that can and very often do occur. I apologize for any typographical errors that were not detected and corrected.     Emily FilbertWilliams, Jonathan E, MD 02/08/17 [phone]

## 2017-02-08 NOTE — ED Notes (Signed)
Pt transported to room 108 

## 2017-02-08 NOTE — ED Notes (Signed)
Pt given food tray and water.

## 2017-02-08 NOTE — Clinical Social Work Note (Signed)
CSW received consult for "No food, no heat, only beer and copd." CSW acknowledges consult, but could not assess today. CSW will assess pt tomorrow morning, as he has been admitted inpatient. CSW continuing to follow for discharge planning.   Corlis HoveJeneya Viral Schramm, Theresia MajorsLCSWA, [facility] Clinical Social Worker-ED [phone]

## 2017-02-08 NOTE — ED Notes (Signed)
MRI screening patient °

## 2017-02-08 NOTE — ED Provider Notes (Addendum)
I discussed the case with on-call neurologist Dr. Thad Rangereynolds regarding the possibility of more like a Korsakoff syndrome. The patient is confabulating and is clearly ataxic. He doesn't have ophthalmoplegia but he does have some mild nystagmus. We don't have another clear reason for his altered mental status. Dr. Thad Rangereynolds recommends an MRI of the brain with and without contrast and treatment with thiamine presumptively.  She also recommends a thiamine level prior to treatment.   Merrily Brittleifenbark, Jazmynn Pho, MD 02/08/17 1723   The patient has an elevated anion gap along with a low pH concerning for alcoholic ketoacidosis. He is also altered concerning for an acute Korsakoff syndrome. He additionally has a urinary tract infection. He'll require high-dose IV thiamine, IV antibiotics, inpatient admission.   Merrily Brittleifenbark, Cathlyn Tersigni, MD 02/08/17 1755   Apparently the patient [facility]. [facility] now [facility].   Merrily Brittleifenbark, Amiria Orrison, MD 02/08/17 1831  ----------------------------------------- 8:15 PM on 02/08/2017 -----------------------------------------  The [facility] is on diversion and they've authorized the patient to be admitted to our facility.   Merrily Brittleifenbark, Cheyrl Buley, MD 02/08/17 2015

## 2017-02-08 NOTE — ED Notes (Signed)
Patient transported to X-ray 

## 2017-02-08 NOTE — ED Triage Notes (Signed)
Pt to ED via EMS from home c/o SOB.  EMS states patient was found today by family struggling to breathe.  Patient last seen by family on Friday.  Per EMS unable to obtain oxygen sat on scene.  Given 125mg  solumedrol and a duoneb treatment and 100cc normal saline, patient had wheezes and rails throughout.  Per EMS there was no food in the house, a couple trash cans with empty beer cans, and very cold in the house.  Upon arrival patient states relief in breathing, unlabored, clear and diminished lung sounds.  EMS CBG 167.

## 2017-02-08 NOTE — H&P (Addendum)
[facility] [place] [facility]   PATIENT NAME: [name] [name]    MR#:  [id number]  DATE OF BIRTH:  07-30-1945  DATE OF ADMISSION:  02/08/2017  PRIMARY CARE PHYSICIAN: [facility]   REQUESTING/REFERRING PHYSICIAN: Rifenbark  CHIEF COMPLAINT:   Chief Complaint  Patient presents with  . Shortness of Breath    HISTORY OF PRESENT ILLNESS: [name] [name]  is a 71 y.o. male with a known history of COPD, Alcoholism- have been weak and more confused for last few days, and had urinated on himself so his visitor called EMS. Daughter in room, tells that at baseline, he drinks alcohol daily and also smokes, have some dementia and confusion at baseline. Worse for few days. Noted to have UTI,sepsis and also concern for Wernicke's encephalopathy- ER spoke to Neurology and suggested to have MRI. [facility]  PAST MEDICAL HISTORY:   Past Medical History:  Diagnosis Date  . COPD (chronic obstructive pulmonary disease) (HCC)   . Male erectile disorder     PAST SURGICAL HISTORY: Past Surgical History:  Procedure Laterality Date  . KNEE SURGERY Right     SOCIAL HISTORY:  Social History  Substance Use Topics  . Smoking status: Current Some Day Smoker    Packs/day: 0.25  . Smokeless tobacco: Never Used  . Alcohol use 16.8 oz/week    28 Cans of beer per week    FAMILY HISTORY:  Family History  Problem Relation Age of Onset  . Hypertension Mother     DRUG ALLERGIES: No Known Allergies  REVIEW OF SYSTEMS:   CONSTITUTIONAL: No fever,positive for fatigue or weakness.  EYES: No blurred or double vision.  EARS, NOSE, AND THROAT: No tinnitus or ear pain.  RESPIRATORY: No cough, shortness of breath, wheezing or hemoptysis.  CARDIOVASCULAR: No chest pain, orthopnea, edema.  GASTROINTESTINAL: No nausea, vomiting, diarrhea or abdominal pain.  GENITOURINARY: No dysuria, hematuria.  ENDOCRINE: No polyuria, nocturia,   HEMATOLOGY: No anemia, easy bruising or bleeding SKIN: No rash or lesion. MUSCULOSKELETAL: No joint pain or arthritis.   NEUROLOGIC: No tingling, numbness, weakness.  PSYCHIATRY: No anxiety or depression.   MEDICATIONS AT HOME:  Prior to Admission medications   Medication Sig Start Date End Date Taking? Authorizing Provider  albuterol (PROVENTIL HFA;VENTOLIN HFA) 108 (90 Base) MCG/ACT inhaler Inhale 2 puffs into the lungs every 6 (six) hours as needed for wheezing or shortness of breath. 05/22/16  Yes Don Perking, [place], MD      PHYSICAL EXAMINATION:   VITAL SIGNS: Blood pressure 124/82, pulse 95, temperature 98.4 F (36.9 C), temperature source Oral, resp. rate 14, height 5\' 8"  (1.727 m), weight 54 kg (119 lb), SpO2 94 %.  GENERAL:  71 y.o.-year-old patient lying in the bed with no acute distress.  EYES: Pupils equal, round, reactive to light and accommodation. No scleral icterus. Extraocular muscles intact.  HEENT: Head atraumatic, normocephalic. Oropharynx and nasopharynx clear.  NECK:  Supple, no jugular venous distention. No thyroid enlargement, no tenderness.  LUNGS: Normal breath sounds bilaterally, no wheezing, rales,rhonchi or crepitation. No use of accessory muscles of respiration.  CARDIOVASCULAR: S1, S2 normal. No murmurs, rubs, or gallops.  ABDOMEN: Soft, nontender, nondistended. Bowel sounds present. No organomegaly or mass.  EXTREMITIES: No pedal edema, cyanosis, or clubbing.  NEUROLOGIC: Cranial nerves II through XII are intact. Muscle strength 4-5/5 in all extremities. Sensation intact. Gait not checked.  PSYCHIATRIC: The patient is alert and oriented x  3.  SKIN: No obvious rash, lesion, or ulcer.   LABORATORY PANEL:   CBC  Recent Labs Lab 02/08/17 1410  WBC 14.0*  HGB 14.0  HCT 43.9  PLT 200  MCV 86.0  MCH 27.5  MCHC 32.0  RDW 17.2*  LYMPHSABS 1.4  MONOABS 1.5*  EOSABS 0.0  BASOSABS 0.1    ------------------------------------------------------------------------------------------------------------------  Chemistries   Recent Labs Lab 02/08/17 1410  NA 137  K 3.7  CL 94*  CO2 25  GLUCOSE 111*  BUN 18  CREATININE 1.01  CALCIUM 9.5   ------------------------------------------------------------------------------------------------------------------ estimated creatinine clearance is 52 mL/min (by C-G formula based on SCr of 1.01 mg/dL). ------------------------------------------------------------------------------------------------------------------ No results for input(s): TSH, T4TOTAL, T3FREE, THYROIDAB in the last 72 hours.  Invalid input(s): FREET3   Coagulation profile No results for input(s): INR, PROTIME in the last 168 hours. ------------------------------------------------------------------------------------------------------------------- No results for input(s): DDIMER in the last 72 hours. -------------------------------------------------------------------------------------------------------------------  Cardiac Enzymes  Recent Labs Lab 02/08/17 1410  TROPONINI 0.03*   ------------------------------------------------------------------------------------------------------------------ Invalid input(s): POCBNP  ---------------------------------------------------------------------------------------------------------------  Urinalysis    Component Value Date/Time   COLORURINE AMBER (A) 02/08/2017 1520   APPEARANCEUR CLOUDY (A) 02/08/2017 1520   APPEARANCEUR Hazy 08/02/2011 1539   LABSPEC 1.027 02/08/2017 1520   LABSPEC 1.026 08/02/2011 1539   PHURINE 5.0 02/08/2017 1520   GLUCOSEU 50 (A) 02/08/2017 1520   GLUCOSEU Negative 08/02/2011 1539   HGBUR SMALL (A) 02/08/2017 1520   BILIRUBINUR SMALL (A) 02/08/2017 1520   BILIRUBINUR Negative 08/02/2011 1539   KETONESUR 20 (A) 02/08/2017 1520   PROTEINUR >=300 (A) 02/08/2017 1520   NITRITE NEGATIVE  02/08/2017 1520   LEUKOCYTESUR NEGATIVE 02/08/2017 1520   LEUKOCYTESUR Negative 08/02/2011 1539     RADIOLOGY: Ct Head Wo Contrast  Result Date: 02/08/2017 CLINICAL DATA:  Difficulty breathing, altered level of consciousness EXAM: CT HEAD WITHOUT CONTRAST TECHNIQUE: Contiguous axial images were obtained from the base of the skull through the vertex without intravenous contrast. COMPARISON:  07/03/2014 FINDINGS: Brain: Extensive hypodensity in the bilateral white matter, presumably due to small vessel ischemic changes, similar compared to the prior study. Stable ventricle size. No hemorrhage or mass. Vascular: No hyperdense vessels.  Carotid artery calcifications. Skull: No fracture or suspicious bone lesion Sinuses/Orbits: No acute finding. Other: None IMPRESSION: 1. No CT evidence for acute intracranial abnormality. 2. Marked hypodensity in the bilateral white matter which may be secondary to small vessel disease or other white matter disease. Electronically Signed   By: Jasmine PangKim  Fujinaga M.D.   On: 02/08/2017 15:51   Dg Chest Port 1 View  Result Date: 02/08/2017 CLINICAL DATA:  Shortness of breath today in a patient with COPD. EXAM: PORTABLE CHEST 1 VIEW COMPARISON:  PA and lateral chest 05/22/2016 and 08/02/2011. CT chest 06/06/2008. FINDINGS: The chest is hyperexpanded with attenuation of the pulmonary vasculature. Lungs are clear. No pneumothorax or pleural fluid. No bony abnormality. IMPRESSION: Emphysema without acute disease. Electronically Signed   By: Drusilla Kannerhomas  Dalessio M.D.   On: 02/08/2017 14:41    EKG: Orders placed or performed during the hospital encounter of 02/08/17  . ED EKG  . ED EKG  . EKG 12-Lead  . EKG 12-Lead    IMPRESSION AND PLAN:  * Sepsis    UTI    IV rocephin, IV fluids    Follow cx result.  * Altered mental status   Concern for wernicke's encephalopathy   MRI   Thiamine and folic acid   PT eval.  * Chronic alcoholism   Keep  on CIWA for withdrawal  watch.   * COPD   Albuterol as needed.  * Smoking   Counceleld to Quit for 4 min.  All the records are reviewed and case discussed with ED provider. Management plans discussed with the patient, family and they are in agreement.  CODE STATUS: Code Status History    This patient does not have a recorded code status. Please follow your organizational policy for patients in this situation.     Discussed with Daughter in room.  TOTAL TIME TAKING CARE OF THIS PATIENT: 45 minutes.    Altamese Dilling M.D on 02/08/2017   Between 7am to 6pm - Pager - [phone]  After 6pm go to [email or web address] - password [email or web address]  [facility]  Office  [phone]  CC: Primary care physician; [facility]   Note: This dictation was prepared with Dragon dictation along with smaller phrase technology. Any transcriptional errors that result from this process are unintentional.

## 2017-02-09 ENCOUNTER — Inpatient Hospital Stay: Payer: Medicare Other

## 2017-02-09 LAB — BASIC METABOLIC PANEL
ANION GAP: 7 (ref 5–15)
BUN: 20 mg/dL (ref 6–20)
CO2: 29 mmol/L (ref 22–32)
Calcium: 8.8 mg/dL — ABNORMAL LOW (ref 8.9–10.3)
Chloride: 101 mmol/L (ref 101–111)
Creatinine, Ser: 0.82 mg/dL (ref 0.61–1.24)
GFR calc Af Amer: >60 mL/min (ref >60)
GLUCOSE: 178 mg/dL — AB (ref 65–99)
POTASSIUM: 3.8 mmol/L (ref 3.5–5.1)
Sodium: 137 mmol/L (ref 135–145)

## 2017-02-09 LAB — CBC
HEMATOCRIT: 36.3 % — AB (ref 40.0–52.0)
HEMOGLOBIN: 11.9 g/dL — AB (ref 13.0–18.0)
MCH: 28 pg (ref 26.0–34.0)
MCHC: 32.9 g/dL (ref 32.0–36.0)
MCV: 85.2 fL (ref 80.0–100.0)
Platelets: 178 10*3/uL (ref 150–440)
RBC: 4.26 MIL/uL — ABNORMAL LOW (ref 4.40–5.90)
RDW: 17.1 % — AB (ref 11.5–14.5)
WBC: 12.2 10*3/uL — ABNORMAL HIGH (ref 3.8–10.6)

## 2017-02-09 MED ORDER — BISACODYL 5 MG PO TBEC: 5.0000 mg | DELAYED_RELEASE_TABLET | Freq: Every day | ORAL | Status: DC | PRN

## 2017-02-09 MED ORDER — DOCUSATE SODIUM 100 MG PO CAPS: 100.0000 mg | ORAL_CAPSULE | Freq: Two times a day (BID) | ORAL | Status: DC | PRN

## 2017-02-09 MED ORDER — HEPARIN SODIUM (PORCINE) 5000 UNIT/ML IJ SOLN
5000.0000 [IU] | Freq: Three times a day (TID) | INTRAMUSCULAR | Status: DC
  Administered 2017-02-09 – 2017-02-11 (×5): 5000 [IU] via SUBCUTANEOUS
  Filled 2017-02-09 (×6): qty 1

## 2017-02-09 MED ORDER — ALBUTEROL SULFATE (2.5 MG/3ML) 0.083% IN NEBU
2.5000 mg | INHALATION_SOLUTION | Freq: Four times a day (QID) | RESPIRATORY_TRACT | Status: DC | PRN
  Administered 2017-02-10: 2.5 mg via RESPIRATORY_TRACT
  Filled 2017-02-09: qty 3

## 2017-02-09 MED ORDER — THIAMINE HCL 100 MG/ML IJ SOLN
250.0000 mg | Freq: Every day | INTRAMUSCULAR | Status: DC
  Administered 2017-02-10 – 2017-02-11 (×2): 250 mg via INTRAVENOUS
  Filled 2017-02-09 (×2): qty 2.5

## 2017-02-09 MED ORDER — MORPHINE SULFATE (PF) 2 MG/ML IV SOLN: 2.0000 mg | INTRAVENOUS | Status: DC | PRN

## 2017-02-09 MED ORDER — GADOBENATE DIMEGLUMINE 529 MG/ML IV SOLN
10.0000 mL | Freq: Once | INTRAVENOUS | Status: AC | PRN
  Administered 2017-02-09: 03:00:00 10 mL via INTRAVENOUS

## 2017-02-09 MED ORDER — THIAMINE HCL 100 MG/ML IJ SOLN: 250.0000 mg | Freq: Every day | INTRAMUSCULAR | Status: DC

## 2017-02-09 MED ORDER — ENSURE ENLIVE PO LIQD
237.0000 mL | Freq: Three times a day (TID) | ORAL | Status: DC
  Administered 2017-02-10 – 2017-02-11 (×2): 237 mL via ORAL

## 2017-02-09 MED ORDER — DEXTROSE 5 % IV SOLN
1.0000 g | INTRAVENOUS | Status: DC
  Administered 2017-02-09 – 2017-02-10 (×2): 1 g via INTRAVENOUS
  Filled 2017-02-09 (×3): qty 10

## 2017-02-09 MED ORDER — ACETAMINOPHEN 325 MG PO TABS: 650.0000 mg | ORAL_TABLET | Freq: Four times a day (QID) | ORAL | Status: DC | PRN

## 2017-02-09 MED ORDER — ONDANSETRON HCL 4 MG/2ML IJ SOLN: 4.0000 mg | Freq: Four times a day (QID) | INTRAMUSCULAR | Status: DC | PRN

## 2017-02-09 MED ORDER — THIAMINE HCL 100 MG/ML IJ SOLN
500.0000 mg | Freq: Once | INTRAMUSCULAR | Status: AC
  Administered 2017-02-09: 500 mg via INTRAVENOUS
  Filled 2017-02-09: qty 5

## 2017-02-09 MED ORDER — OXYCODONE-ACETAMINOPHEN 5-325 MG PO TABS: 1.0000 | ORAL_TABLET | Freq: Four times a day (QID) | ORAL | Status: DC | PRN

## 2017-02-09 NOTE — Progress Notes (Addendum)
[facility] - [place] at [facility]   PATIENT NAME: [name] Menghinihomas Aldama    MR#:  [id number]  DATE OF BIRTH:  08-Mar-1946  SUBJECTIVE:  CHIEF COMPLAINT:   Chief Complaint  Patient presents with  . Shortness of Breath   The patient is very confused, he doesn't know his name, location and time. He does not follow commands. REVIEW OF SYSTEMS:  Review of Systems  Unable to perform ROS: Mental status change    DRUG ALLERGIES:  No Known Allergies VITALS:  Blood pressure 113/70, pulse 87, temperature 98.3 F (36.8 C), temperature source Oral, resp. rate 20, height 5\' 8"  (1.727 m), weight 117 lb 3.2 oz (53.2 kg), SpO2 99 %. PHYSICAL EXAMINATION:  Physical Exam  Constitutional:  Severe malnutrition.  HENT:  Head: Normocephalic.  Eyes: Pupils are equal, round, and reactive to light. Conjunctivae and EOM are normal. No scleral icterus.  Neck: Normal range of motion. Neck supple. No JVD present. No tracheal deviation present.  Cardiovascular: Normal rate, regular rhythm and normal heart sounds.  Exam reveals no gallop.   No murmur heard. Pulmonary/Chest: Effort normal and breath sounds normal. No respiratory distress. He has no wheezes. He has no rales.  Abdominal: Soft. Bowel sounds are normal. He exhibits no distension. There is no tenderness. There is no rebound.  Musculoskeletal: He exhibits no edema or tenderness.  Neurological: No cranial nerve deficit.  The patient is very confused, he doesn't know his name, location and time. He does not follow commands.  Skin: No rash noted. No erythema.  Psychiatric:  Confused.   LABORATORY PANEL:  Male CBC  Recent Labs Lab 02/09/17 0445  WBC 12.2*  HGB 11.9*  HCT 36.3*  PLT 178   ------------------------------------------------------------------------------------------------------------------ Chemistries   Recent Labs Lab 02/09/17 0445  NA 137  K 3.8  CL 101  CO2 29  GLUCOSE 178*  BUN 20  CREATININE 0.82   CALCIUM 8.8*   RADIOLOGY:  Dg Skull Complete  Result Date: 02/08/2017 CLINICAL DATA:  Altered mental status EXAM: SKULL - COMPLETE 4 + VIEW COMPARISON:  None. FINDINGS: There is no evidence of skull fracture or other focal bone lesions. Limited by positioning. IMPRESSION: Negative. Electronically Signed   By: Jasmine PangKim  Fujinaga M.D.   On: 02/08/2017 22:16   Ct Head Wo Contrast  Result Date: 02/08/2017 CLINICAL DATA:  Difficulty breathing, altered level of consciousness EXAM: CT HEAD WITHOUT CONTRAST TECHNIQUE: Contiguous axial images were obtained from the base of the skull through the vertex without intravenous contrast. COMPARISON:  07/03/2014 FINDINGS: Brain: Extensive hypodensity in the bilateral white matter, presumably due to small vessel ischemic changes, similar compared to the prior study. Stable ventricle size. No hemorrhage or mass. Vascular: No hyperdense vessels.  Carotid artery calcifications. Skull: No fracture or suspicious bone lesion Sinuses/Orbits: No acute finding. Other: None IMPRESSION: 1. No CT evidence for acute intracranial abnormality. 2. Marked hypodensity in the bilateral white matter which may be secondary to small vessel disease or other white matter disease. Electronically Signed   By: Jasmine PangKim  Fujinaga M.D.   On: 02/08/2017 15:51   Mr Laqueta JeanBrain W And Wo Contrast  Result Date: 02/09/2017 CLINICAL DATA:  Confusion, urinary incontinence. History of alcoholism, dementia. Assess for Wernicke's encephalopathy. EXAM: MRI HEAD WITHOUT AND WITH CONTRAST TECHNIQUE: Multiplanar, multiecho pulse sequences of the brain and surrounding structures were obtained without and with intravenous contrast. CONTRAST:  10mL MULTIHANCE GADOBENATE DIMEGLUMINE 529 MG/ML IV SOLN COMPARISON:  CT HEAD February 08, 2017 FINDINGS: BRAIN:  No reduced diffusion to suggest acute ischemia. A few scattered chronic micro hemorrhages. The ventricles and sulci are normal for patient's age. Confluent supratentorial white  matter FLAIR T2 hyperintensities. 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 Small area LEFT occipital lobe and LEFT frontal lobe encephalomalacia. No abnormal intraparenchymal or extra-axial enhancement. No abnormal extra-axial fluid collections. VASCULAR: Normal major intracranial vascular flow voids present at skull base. SKULL AND UPPER CERVICAL SPINE: No abnormal sellar expansion. No suspicious calvarial bone marrow signal. Craniocervical junction maintained. SINUSES/ORBITS: Trace paranasal sinus mucosal thickening. Mastoid air cells are well aerated. The included ocular globes and orbital contents are non-suspicious. OTHER: None. IMPRESSION: 1. No acute intracranial process. 2. Moderate to severe chronic small vessel ischemic disease. 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 3. Focal LEFT frontal and and LEFT occipital lobe encephalomalacia favoring posttraumatic etiology. Electronically Signed   By: Awilda Metro M.D.   On: 02/09/2017 03:08   Dg Chest Port 1 View  Result Date: 02/08/2017 CLINICAL DATA:  Shortness of breath today in a patient with COPD. EXAM: PORTABLE CHEST 1 VIEW COMPARISON:  PA and lateral chest 05/22/2016 and 08/02/2011. CT chest 06/06/2008. FINDINGS: The chest is hyperexpanded with attenuation of the pulmonary vasculature. Lungs are clear. No pneumothorax or pleural fluid. No bony abnormality. IMPRESSION: Emphysema without acute disease. Electronically Signed   By: Drusilla Kanner M.D.   On: 02/08/2017 14:41   ASSESSMENT AND PLAN:   * Sepsis due to UTI Continue  IV rocephin, IV fluids, Follow cx result. Leukocytosis is improving.  * Altered mental status due to above.   Concern for wernicke's encephalopathy  MRI of brain: No acute intracranial process.   Thiamine and folic acid   PT eval. aspiration and fall precaution.  * Chronic alcoholism   Keep on CIWA for withdrawal watch.  * COPD, stable.   Albuterol as  needed.  * Smoking   Counceleld to Quit for 4 min.  Severe malnutrition. Dietitian consult.  PT evaluation suggested SNF. All the records are reviewed and case discussed with Care Management/Social Worker. Management plans discussed with the patient, family and they are in agreement.  CODE STATUS: Full Code  TOTAL TIME TAKING CARE OF THIS PATIENT: 36 minutes.   More than 50% of the time was spent in counseling/coordination of care: YES  POSSIBLE D/C IN 2-3 DAYS, DEPENDING ON CLINICAL CONDITION.   Shaune Pollack M.D on 02/09/2017 at 12:35 PM  Between 7am to 6pm - Pager - [phone]  After 6pm go to [email or web address] - [facility]

## 2017-02-09 NOTE — [place] FL2 (Signed)
[place] MEDICAID FL2 LEVEL OF CARE SCREENING TOOL     IDENTIFICATION  Patient Name: [name] [name] Birthdate: [birth year] Sex: male Admission Date (Current Location): 02/08/2017  [facility] [place]  [place]  ([id number]) Facility and Address:  [facility], [address], [place], [place] [address]      Provider Number: [id number]  Attending Physician Name and Address:  Shaune Pollackhen, Qing, MD  Relative Name and Phone Number:       Current Level of Care: Hospital Recommended Level of Care: Skilled Nursing Facility Prior Approval Number:    Date Approved/Denied:   PASRR Number:  [id number])  Discharge Plan: SNF    Current Diagnoses: Patient Active Problem List   Diagnosis Date Noted  . Sepsis (HCC) 02/08/2017  . UTI (urinary tract infection) 02/08/2017  . Alcoholism (HCC) 02/08/2017    Orientation RESPIRATION BLADDER Height & Weight     Self  Normal Incontinent Weight: 117 lb 3.2 oz (53.2 kg) Height:  5\' 8"  (172.7 cm)  BEHAVIORAL SYMPTOMS/MOOD NEUROLOGICAL BOWEL NUTRITION STATUS      Continent Diet (Diet: Heart Healthy )  AMBULATORY STATUS COMMUNICATION OF NEEDS Skin   Extensive Assist Verbally Normal                       Personal Care Assistance Level of Assistance  Bathing, Feeding, Dressing Bathing Assistance: Limited assistance Feeding assistance: Independent Dressing Assistance: Limited assistance     Functional Limitations Info  Sight, Hearing, Speech Sight Info: Adequate Hearing Info: Adequate Speech Info: Adequate    SPECIAL CARE FACTORS FREQUENCY  PT (By licensed PT), OT (By licensed OT)     PT Frequency:  (5) OT Frequency:  (5)            Contractures      Additional Factors Info  Code Status, Allergies Code Status Info:  (Full Code. ) Allergies Info:  (No Known Allergies. )           Current Medications (02/09/2017):  This is the current hospital active medication list Current  Facility-Administered Medications  Medication Dose Route Frequency Provider Last Rate Last Dose  . 0.9 %  sodium chloride infusion   Intravenous Continuous Altamese DillingVachhani, Vaibhavkumar, MD 50 mL/hr at 02/09/17 1100    . acetaminophen (TYLENOL) tablet 650 mg  650 mg Oral Q6H PRN Shaune Pollackhen, Qing, MD      . albuterol (PROVENTIL) (2.5 MG/3ML) 0.083% nebulizer solution 2.5 mg  2.5 mg Inhalation Q6H PRN Altamese DillingVachhani, Vaibhavkumar, MD      . bisacodyl (DULCOLAX) EC tablet 5 mg  5 mg Oral Daily PRN Shaune Pollackhen, Qing, MD      . cefTRIAXone (ROCEPHIN) 1 g in dextrose 5 % 50 mL IVPB  1 g Intravenous Q24H Altamese DillingVachhani, Vaibhavkumar, MD      . docusate sodium (COLACE) capsule 100 mg  100 mg Oral BID PRN Altamese DillingVachhani, Vaibhavkumar, MD      . folic acid (FOLVITE) tablet 1 mg  1 mg Oral Daily Altamese DillingVachhani, Vaibhavkumar, MD   1 mg at 02/09/17 1000  . heparin injection 5,000 Units  5,000 Units Subcutaneous Q8H Altamese DillingVachhani, Vaibhavkumar, MD   5,000 Units at 02/09/17 1405  . LORazepam (ATIVAN) tablet 1 mg  1 mg Oral Q6H PRN Altamese DillingVachhani, Vaibhavkumar, MD       Or  . LORazepam (ATIVAN) injection 1 mg  1 mg Intravenous Q6H PRN Altamese DillingVachhani, Vaibhavkumar, MD   1 mg at 02/09/17 0339  . morphine 2 MG/ML injection 2 mg  2 mg Intravenous Q4H PRN Shaune Pollack, MD      . multivitamin with minerals tablet 1 tablet  1 tablet Oral Daily Altamese Dilling, MD   1 tablet at 02/09/17 1000  . ondansetron (ZOFRAN) injection 4 mg  4 mg Intravenous Q6H PRN Shaune Pollack, MD      . oxyCODONE-acetaminophen (PERCOCET/ROXICET) 5-325 MG per tablet 1 tablet  1 tablet Oral Q6H PRN Shaune Pollack, MD      . Melene Muller ON 02/10/2017] thiamine (B-1) injection 250 mg  250 mg Intravenous Daily Shaune Pollack, MD         Discharge Medications: Please see discharge summary for a list of discharge medications.  Relevant Imaging Results:  Relevant Lab Results:   Additional Information  (SSN: [id number])  Rohail Klees, Darleen Crocker, LCSW

## 2017-02-09 NOTE — Progress Notes (Signed)
Initial Nutrition Assessment  DOCUMENTATION CODES:   Severe malnutrition in context of chronic illness, Underweight  INTERVENTION:  If Wernicke's encephalopathy is suspected, recommend initiating treatment with thiamine 500 mg IV infused over 30 minutes for two days, followed by thiamine 250 mg IV for 5 days. After first week of IV treatment, can provide thiamine 100 mg PO daily.  Recommend downgrading diet to dysphagia 3 (mechanical soft) with thin liquids.  Monitor magnesium, potassium, and phosphorus daily for at least 3 days, MD to replete as needed, as pt is at risk for refeeding syndrome given severe malnutrition, EtOH abuse.  Provide Ensure Enlive po TID, each supplement provides 350 kcal and 20 grams of protein.  Continue daily multivitamin with minerals and folic acid 1 mg.  NUTRITION DIAGNOSIS:   Malnutrition (Severe) related to chronic illness (EtOH abuse) as evidenced by severe depletion of body fat, severe depletion of muscle mass.  GOAL:   Patient will meet greater than or equal to 90% of their needs  MONITOR:   PO intake, Supplement acceptance, Labs, Weight trends, I & O's  REASON FOR ASSESSMENT:   Malnutrition Screening Tool, Consult Assessment of nutrition requirement/status  ASSESSMENT:   71 year old male with PMHx of COPD, EtOH abuse admitted with sepsis due to UTI, AMS, concern for Wernicke's encephalopathy.   Patient is very confused and is not a reliable historian at this time. He was eating breakfast at time of assessment. Had eaten all of his eggs and was working on his oatmeal. He reports his appetite is good. He is unsure how he typically eats though, and reports he lives at a "camp" nearby.   There is unreliable weight history in chart as they do not appear to be true weights.   Medications reviewed and include: MVI daily, thiamine 100 mg daily, folic acid 1 mg daily, NS @ 50 ml/hr, ceftriaxone.  Labs reviewed: Glucose 178.  Nutrition-Focused  physical exam completed. Findings are severe fat depletion, severe muscle depletion, and no edema. When assessing for signs of possible micronutrient depletions patient was unable to move his eyes side-to-side.  Diet Order:  Diet Heart Room service appropriate? Yes; Fluid consistency: Thin  Skin:  Reviewed, no issues  Last BM:  Unknown  Height:   Ht Readings from Last 1 Encounters:  02/08/17 5\' 8"  (1.727 m)    Weight:   Wt Readings from Last 1 Encounters:  02/08/17 117 lb 3.2 oz (53.2 kg)    Ideal Body Weight:  70 kg  BMI:  Body mass index is 17.82 kg/m.  Estimated Nutritional Needs:   Kcal:  1650-1900 (MSJ x 1.3-1.5)  Protein:  80-95 grams (1.5-1.8 grams/kg)  Fluid:  1.6-1.8 L/day (30-35 ml/kg)  EDUCATION NEEDS:   Education needs no appropriate at this time  Helane RimaLeanne Varsha Knock, MS, RD, LDN Office: [phone] Pager: [phone] After Hours/Weekend Pager: [phone]

## 2017-02-09 NOTE — Evaluation (Signed)
Physical Therapy Evaluation Patient Details Name: [name] [name] MRN: [id number] DOB: 12/30/1945 Today's Date: 02/09/2017   History of Present Illness  71 yo male with onset of UTI with AMS and CIWA protocol in place was admitted, has PMHx:  old stroke, emphysema, encephalomalacia, EtOH, dementia, COPD   Clinical Impression  Pt is confused and unable to give much information about his PLOF.  He was apparently at home alone on RW, but does not recall any details to fill in the information.  Will follow acutely as he may not be able to go directly home, and instead should be inpt in SNF for rehab prior to consideration for his ability to safely return home alone.  Family may be available as well but no clear information was available at the evaluation.  Follow acutely for strength and balance training.      Follow Up Recommendations SNF    Equipment Recommendations  None recommended by PT    Recommendations for Other Services       Precautions / Restrictions Precautions Precautions: Fall (telemetry) Precaution Comments: incontinent Restrictions Weight Bearing Restrictions: No      Mobility  Bed Mobility Overal bed mobility: Needs Assistance Bed Mobility: Sidelying to Sit   Sidelying to sit: Mod assist       General bed mobility comments: pt initially resisted PT help then allowed her to lift under trunk to sit up  Transfers Overall transfer level: Needs assistance Equipment used: Rolling walker (2 wheeled);1 person hand held assist Transfers: Sit to/from Stand Sit to Stand: Mod assist         General transfer comment: mod assist to power up initially then better min mod to pivot to the [facility]  Ambulation/Gait Ambulation/Gait assistance: Min assist Ambulation Distance (Feet): 5 Feet Assistive device: Rolling walker (2 wheeled);1 person hand held assist Gait Pattern/deviations: Step-to pattern;Step-through pattern;Decreased stride length;Wide base of support;Narrow base  of support;Trunk flexed Gait velocity: reduced Gait velocity interpretation: Below normal speed for age/gender General Gait Details: variable steps with pt being incontinent of bladder during the transition to the [facility]  Stairs            Wheelchair Mobility    Modified Rankin (Stroke Patients Only) Modified Rankin (Stroke Patients Only) Pre-Morbid Rankin Score: Slight disability Modified Rankin: Moderate disability     Balance Overall balance assessment: Needs assistance Sitting-balance support: Feet supported;Bilateral upper extremity supported Sitting balance-Leahy Scale: Fair     Standing balance support: Bilateral upper extremity supported;During functional activity Standing balance-Leahy Scale: Poor                               Pertinent Vitals/Pain Pain Assessment: No/denies pain    Home Living Family/patient expects to be discharged to:: Unsure Living Arrangements: Alone               Additional Comments: pt cannot give PT a full history    Prior Function Level of Independence:  (pt unable to give history)         Comments: states he used Walker before but may not be accurate     Hand Dominance        Extremity/Trunk Assessment   Upper Extremity Assessment Upper Extremity Assessment: Generalized weakness    Lower Extremity Assessment Lower Extremity Assessment: Generalized weakness    Cervical / Trunk Assessment Cervical / Trunk Assessment: Kyphotic  Communication   Communication: Expressive difficulties  Cognition Arousal/Alertness: Lethargic;Awake/alert Behavior During  Therapy: Impulsive Overall Cognitive Status: History of cognitive impairments - at baseline                                 General Comments: dementia is listed under diagnoses      General Comments      Exercises     Assessment/Plan    PT Assessment Patient needs continued PT services  PT Problem List Decreased  strength;Decreased range of motion;Decreased activity tolerance;Decreased balance;Decreased mobility;Decreased coordination;Decreased cognition;Decreased knowledge of use of DME;Decreased safety awareness;Decreased knowledge of precautions;Cardiopulmonary status limiting activity       PT Treatment Interventions DME instruction;Gait training;Functional mobility training;Therapeutic activities;Therapeutic exercise;Balance training;Neuromuscular re-education;Patient/family education    PT Goals (Current goals can be found in the Care Plan section)  Acute Rehab PT Goals Patient Stated Goal: none stated PT Goal Formulation: Patient unable to participate in goal setting Time For Goal Achievement: 02/23/17 Potential to Achieve Goals: Good    Frequency Min 2X/week   Barriers to discharge Other (comment) (pt states he is home alone)      Co-evaluation               AM-PAC PT "6 Clicks" Daily Activity  Outcome Measure Difficulty turning over in bed (including adjusting bedclothes, sheets and blankets)?: Unable Difficulty moving from lying on back to sitting on the side of the bed? : Unable Difficulty sitting down on and standing up from a chair with arms (e.g., wheelchair, bedside commode, etc,.)?: Unable Help needed moving to and from a bed to chair (including a wheelchair)?: A Lot Help needed walking in hospital room?: A Lot Help needed climbing 3-5 steps with a railing? : Total 6 Click Score: 8    End of Session Equipment Utilized During Treatment: Gait belt Activity Tolerance: Patient tolerated treatment well;Patient limited by fatigue;Other (comment) (strength) Patient left: in chair;with call bell/phone within reach;with chair alarm set;with nursing/sitter in room Nurse Communication: Mobility status PT Visit Diagnosis: Unsteadiness on feet (R26.81);Muscle weakness (generalized) (M62.81);Difficulty in walking, not elsewhere classified (R26.2);Hemiplegia and  hemiparesis Hemiplegia - Right/Left: Right Hemiplegia - dominant/non-dominant: Dominant Hemiplegia - caused by: Unspecified    Time: [id number] PT Time Calculation (min) (ACUTE ONLY): 33 min   Charges:   PT Evaluation $PT Eval Moderate Complexity: 1 Mod PT Treatments $Therapeutic Activity: 8-22 mins   PT G Codes:   PT G-Codes **NOT FOR INPATIENT CLASS** Functional Assessment Tool Used: AM-PAC 6 Clicks Basic Mobility   Ivar DrapeRuth E Harmani Neto 02/09/2017, 1:00 PM   Samul Dadauth Rheanne Cortopassi, PT MS Acute Rehab Dept. Number: [facility] [id number] and [facility] [phone]

## 2017-02-10 LAB — CREATININE, SERUM
Creatinine, Ser: 1.01 mg/dL (ref 0.61–1.24)
GFR calc Af Amer: >60 mL/min (ref >60)
GFR calc non Af Amer: >60 mL/min (ref >60)

## 2017-02-10 LAB — VITAMIN B1: VITAMIN B1 (THIAMINE): 173.5 nmol/L (ref 66.5–200.0)

## 2017-02-10 LAB — MAGNESIUM: MAGNESIUM: 1.9 mg/dL (ref 1.7–2.4)

## 2017-02-10 LAB — BLOOD GAS, VENOUS
ACID-BASE EXCESS: 0.4 mmol/L (ref 0.0–2.0)
Bicarbonate: 27.4 mmol/L (ref 20.0–28.0)
Patient temperature: 37
pCO2, Ven: 52 mmHg (ref 44.0–60.0)
pH, Ven: 7.33 (ref 7.250–7.430)
pO2, Ven: UNDETERMINED mmHg (ref 32.0–45.0)

## 2017-02-10 LAB — PHOSPHORUS: Phosphorus: 1.9 mg/dL — ABNORMAL LOW (ref 2.5–4.6)

## 2017-02-10 LAB — POTASSIUM: Potassium: 3.6 mmol/L (ref 3.5–5.1)

## 2017-02-10 MED ORDER — POTASSIUM PHOSPHATES 15 MMOLE/5ML IV SOLN
15.0000 mmol | Freq: Once | INTRAVENOUS | Status: AC
  Administered 2017-02-10: 14:00:00 15 mmol via INTRAVENOUS
  Filled 2017-02-10: qty 5

## 2017-02-10 NOTE — Clinical Social Work Note (Addendum)
Clinical Social Work Assessment  Patient Details  Name: [name] [name] MRN: [id number] Date of Birth: 28-Jun-1945  Date of referral:  02/10/17               Reason for consult:  Facility Placement, Discharge Planning                Permission sought to share information with:  Oceanographeracility Contact Representative Permission granted to share information::  Yes, Verbal Permission Granted  Name::      Skilled Nursing Facility  Agency::   [place]  Relationship::     Contact Information:     Housing/Transportation Living arrangements for the past 2 months:  Single Family Home Source of Information:  Adult Children Patient Interpreter Needed:  None Criminal Activity/Legal Involvement Pertinent to Current Situation/Hospitalization:  No - Comment as needed Significant Relationships:  Adult Children Lives with:  Self Do you feel safe going back to the place where you live?  Yes Need for family participation in patient care:  Yes (Comment)  Care giving concerns:  [place] alone in [place].   Social Worker assessment / plan:  Visual merchandiserClinical Social Worker (CSW) received SNF consult. PT is recommending SNF. Social work Tax inspectorintern attempted to meet with patient, who was alone at bedside, but he was confused and hard to understand. Social work Tax inspectorintern was able to contact patient's daughter [name] DodgeGwendolyn [phone]). Social work Tax inspectorintern introduced self and explained the role of the CSW department. Patient's daughter shared that she is his primary contact. Patient's daughter states that patient lives in [place] alone. Social Work Tax inspectorintern explained that PT is recommending SNF and the SNF process. Social work Tax inspectorintern also explained that [employer] requires a 3 night qualifying inpatient stay in order to pay for SNF. Patient was admitted on 02/08/17. Patient's daughter verbalized her understanding and stated that she is agreeable to SNF search. Social work Tax inspectorintern presented bed offers and patient's daughter chose  [facility]. Social work Tax inspectorintern also shared that a Pharmacist, hospitalresource list with substance abuse information was left at patient's bedside. FL2 completed and faxed out. CSW and social work Tax inspectorintern will continue to follow up and assist.   Jomarie LongsJoseph, [facility] liaison, is aware of accepted bed offer.  Employment status:  Retired Health and safety inspectornsurance information:  Medicare PT Recommendations:  Skilled Nursing Facility Information / Referral to community resources:  Skilled Nursing Facility  Patient/Family's Response to care:  Patient's daughter prefers patient to complete rehab at [facility].  Patient/Family's Understanding of and Emotional Response to Diagnosis, Current Treatment, and Prognosis:  Patient's daughter was pleasant and thanked social work Tax inspectorintern for her assistance.  Emotional Assessment Appearance:  Appears stated age Attitude/Demeanor/Rapport:  Inconsistent Affect (typically observed):  Calm, Pleasant Orientation:  Oriented to Self Alcohol / Substance use:  Alcohol Use Psych involvement (Current and /or in the community):  No (Comment)  Discharge Needs  Concerns to be addressed:  Discharge Planning Concerns, Care Coordination, Substance Abuse Concerns Readmission within the last 30 days:  No Current discharge risk:  Dependent with Mobility, Substance Abuse Barriers to Discharge:  Continued Medical Work up   Payton SparkAnanda A Hodgson, Student-Social Work 02/10/2017, 12:14 PM

## 2017-02-10 NOTE — Progress Notes (Addendum)
MEDICATION RELATED CONSULT NOTE - INITIAL   Pharmacy Consult for Electrolytes  Indication: low phos  No Known Allergies  Patient Measurements: Height: $RemoveBefo reDEID_kehMpBzRpfzqPDHEwvEySfsiHFAcLVCm$5\' 8" (Calculated) : 68.4  Vital Signs: Temp: 98.5 F (36.9 C) (10/25 0559) Temp Source: Oral (10/25 0559) BP: 140/78 (10/25 0559) Pulse Rate: 77 (10/25 0559) Intake/Output from previous day: 10/24 0701 - 10/25 0700 In: 2035.9 [P.O.:480; I.V.:1495.9; IV Piggyback:60] Out: -  Intake/Output from this shift: Total I/O In: 360 [P.O.:360] Out: -   Labs:  Recent Labs  02/08/17 1410 02/09/17 0445 02/10/17 0443  WBC 14.0* 12.2*  --   HGB 14.0 11.9*  --   HCT 43.9 36.3*  --   PLT 200 178  --   CREATININE 1.01 0.82  --   MG  --   --  1.9  PHOS  --   --  1.9*   Estimated Creatinine Clearance: 63.1 mL/min (by C-G formula based on SCr of 0.82 mg/dL).  Assessment: 71 yo male with low phos consulted to replace and monitor electrolytes.   Phos 1.9, Mag 1.9  Goal of Therapy:  Electrolytes WNL  Plan:  Add on SCr and K to AM labs - SCr 1.01 (CrCl 51.2 ml/min), K 3.6 Will order K Phos 15 mmol IV x1 for today Recheck labs in AM  Pharmacy will continue to follow.   Crist FatWang, Tamya Denardo L 02/10/2017,12:01 PM

## 2017-02-10 NOTE — Clinical Social Work Placement (Signed)
   CLINICAL SOCIAL WORK PLACEMENT  NOTE  Date:  02/10/2017  Patient Details  Name: [name] [name] MRN: [id number] Date of Birth: 1945/10/06  Clinical Social Work is seeking post-discharge placement for this patient at the Skilled  Nursing Facility level of care (*CSW will initial, date and re-position this form in  chart as items are completed):  Yes   Patient/family provided with [place] Clinical Social Work Department's list of facilities offering this level of care within the geographic area requested by the patient (or if unable, by the patient's family).  Yes   Patient/family informed of their freedom to choose among providers that offer the needed level of care, that participate in Medicare, Medicaid or managed care program needed by the patient, have an available bed and are willing to accept the patient.  Yes   Patient/family informed of [facility]'s ownership interest in [facility] and [facility], as well as of the fact that they are under no obligation to receive care at these facilities.  PASRR submitted to EDS on 02/09/17     PASRR number received on 02/09/17     Existing PASRR number confirmed on       FL2 transmitted to all facilities in geographic area requested by pt/family on 02/09/17     FL2 transmitted to all facilities within larger geographic area on       Patient informed that his/her managed care company has contracts with or will negotiate with certain facilities, including the following:        Yes   Patient/family informed of bed offers received.  Patient chooses bed at  ([facility] )     Physician recommends and patient chooses bed at      Patient to be transferred to   on  .  Patient to be transferred to facility by       Patient family notified on   of transfer.  Name of family member notified:        PHYSICIAN       Additional Comment:    _______________________________________________ Shaneka Efaw, Darleen CrockerBailey M, LCSW 02/10/2017, 11:22  AM

## 2017-02-10 NOTE — Progress Notes (Signed)
Physical Therapy Treatment Patient Details Name: [name] [name] MRN: [id number] DOB: Jan 21, 1946 Today's Date: 02/10/2017    History of Present Illness 71 yo male with onset of UTI with AMS and CIWA protocol in place was admitted, has PMHx:  old stroke, emphysema, encephalomalacia, EtOH, dementia, COPD     PT Comments    Pt in bed with eyes closed.  Responded to questions but had difficult time fully awakening to participate in session.  Participated in exercises as described below to awaken prior to gait.  Overall mobility improved today.  Bed mobility with rail and min guard.  Once sitting, he was able to maintain upright with supervision.  Stood with min a x 1 with overall poor hand placements despite verbal and tactile cues.  Once standing, he was able to ambulate around nursing unit with walker.  Pt required min/mod a x 1 for gait and recliner follow was used but not needed for safety.  Pt with significant left lean during gait requiring physical assist at all times to prevent fall.  Pt with overall poor walker use keeping it off to the right side for most of gait despite verbal and tactile cues to reposition.  Pt with difficulty with turns and maintaining a proper distance away for walls and objects.  While overall mobility has improved, he remains unsafe to ambulate or transfer by himself and requires significant hands on assist at all times for mobility skills.  He remains confused and was unaware he is in the hospital.   Follow Up Recommendations  SNF     Equipment Recommendations  Rolling walker with 5" wheels    Recommendations for Other Services       Precautions / Restrictions Precautions Precautions: Fall Precaution Comments: incontinent Restrictions Weight Bearing Restrictions: No    Mobility  Bed Mobility Overal bed mobility: Needs Assistance Bed Mobility: Supine to Sit     Supine to sit: Min guard     General bed mobility comments: Improved with rail  today  Transfers Overall transfer level: Needs assistance Equipment used: Rolling walker (2 wheeled)   Sit to Stand: Min assist         General transfer comment: Poor hand placements and overall safety awareness  Ambulation/Gait Ambulation/Gait assistance: Min assist;Mod assist Ambulation Distance (Feet): 160 Feet   Gait Pattern/deviations: Step-through pattern;Narrow base of support;Drifts right/left;Staggering left   Gait velocity interpretation: Below normal speed for age/gender General Gait Details: varriable step pattern, Lists to left during gait with mod assist at times to prevent falls.  Overall poor walker placement and use.   Stairs            Wheelchair Mobility    Modified Rankin (Stroke Patients Only)       Balance Overall balance assessment: Needs assistance Sitting-balance support: Feet supported Sitting balance-Leahy Scale: Fair     Standing balance support: Bilateral upper extremity supported;During functional activity Standing balance-Leahy Scale: Poor Standing balance comment: unable to stand without physical support due to left lean                            Cognition Arousal/Alertness: Lethargic Behavior During Therapy: Impulsive Overall Cognitive Status: History of cognitive impairments - at baseline                                 General Comments: dementia is listed under diagnoses  Exercises Other Exercises Other Exercises: Supine AAROM for ankle pumps, SLR and heel slides to awaken pt prior to gait x 10    General Comments        Pertinent Vitals/Pain Pain Assessment: No/denies pain    Home Living                      Prior Function            PT Goals (current goals can now be found in the care plan section) Progress towards PT goals: Progressing toward goals    Frequency    Min 2X/week      PT Plan Current plan remains appropriate    Co-evaluation               AM-PAC PT "6 Clicks" Daily Activity  Outcome Measure  Difficulty turning over in bed (including adjusting bedclothes, sheets and blankets)?: None Difficulty moving from lying on back to sitting on the side of the bed? : A Little Difficulty sitting down on and standing up from a chair with arms (e.g., wheelchair, bedside commode, etc,.)?: Unable Help needed moving to and from a bed to chair (including a wheelchair)?: A Little Help needed walking in hospital room?: A Lot Help needed climbing 3-5 steps with a railing? : A Lot 6 Click Score: 15    End of Session Equipment Utilized During Treatment: Gait belt Activity Tolerance: Patient tolerated treatment well Patient left: in chair;with chair alarm set;with call bell/phone within reach   Hemiplegia - Right/Left: Right Hemiplegia - dominant/non-dominant: Dominant Hemiplegia - caused by: Unspecified     Time: [id number] PT Time Calculation (min) (ACUTE ONLY): 23 min  Charges:  $Gait Training: 8-22 mins $Therapeutic Exercise: 8-22 mins                    G Codes:       Danielle DessSarah Leslye Puccini, PTA 02/10/17, 8:46 AM

## 2017-02-10 NOTE — Progress Notes (Signed)
[facility] [place] [facility]   PATIENT NAME: [name] [name]    MR#:  [id number]  DATE OF BIRTH:  [birth year]  SUBJECTIVE:   Patient here due to altered mental status.  Mental status improved since admission. Patient sitting up in chair eating breakfast this morning. No other acute events overnight.  REVIEW OF SYSTEMS:    Review of Systems  Constitutional: Negative for chills and fever.  HENT: Negative for congestion and tinnitus.   Eyes: Negative for blurred vision and double vision.  Respiratory: Negative for cough, shortness of breath and wheezing.   Cardiovascular: Negative for chest pain, orthopnea and PND.  Gastrointestinal: Negative for abdominal pain, diarrhea, nausea and vomiting.  Genitourinary: Negative for dysuria and hematuria.  Neurological: Negative for dizziness, sensory change and focal weakness.  All other systems reviewed and are negative.   Nutrition: Dysphagia III with thin liquids Tolerating Diet: Yes Tolerating PT: Eval noted.   DRUG ALLERGIES:  No Known Allergies  VITALS:  Blood pressure 140/78, pulse 77, temperature 98.5 F (36.9 C), temperature source Oral, resp. rate 20, height 5\' 8"  (1.727 m), weight 53.2 kg (117 lb 3.2 oz), SpO2 100 %.  PHYSICAL EXAMINATION:   Physical Exam  GENERAL:  71 y.o.-year-old patient sitting in chair eating breakfast in no acute distress.  EYES: Pupils equal, round, reactive to light and accommodation. No scleral icterus. Extraocular muscles intact.  HEENT: Head atraumatic, normocephalic. Oropharynx and nasopharynx clear.  NECK:  Supple, no jugular venous distention. No thyroid enlargement, no tenderness.  LUNGS: Normal breath sounds bilaterally, no wheezing, rales, rhonchi. No use of accessory muscles of respiration.  CARDIOVASCULAR: S1, S2 normal. No murmurs, rubs, or gallops.  ABDOMEN: Soft, nontender, nondistended. Bowel sounds present. No organomegaly or mass.  EXTREMITIES: No cyanosis,  clubbing or edema b/l.    NEUROLOGIC: Cranial nerves II through XII are intact. No focal Motor or sensory deficits b/l.  Globally weak.  PSYCHIATRIC: The patient is alert and oriented x 1.  SKIN: No obvious rash, lesion, or ulcer.    LABORATORY PANEL:   CBC  Recent Labs Lab 02/09/17 0445  WBC 12.2*  HGB 11.9*  HCT 36.3*  PLT 178   ------------------------------------------------------------------------------------------------------------------  Chemistries   Recent Labs Lab 02/09/17 0445 02/10/17 0436 02/10/17 0443  NA 137  --   --   K 3.8 3.6  --   CL 101  --   --   CO2 29  --   --   GLUCOSE 178*  --   --   BUN 20  --   --   CREATININE 0.82 1.01  --   CALCIUM 8.8*  --   --   MG  --   --  1.9   ------------------------------------------------------------------------------------------------------------------  Cardiac Enzymes  Recent Labs Lab 02/08/17 1410  TROPONINI 0.03*   ------------------------------------------------------------------------------------------------------------------  RADIOLOGY:  Dg Skull Complete  Result Date: 02/08/2017 CLINICAL DATA:  Altered mental status EXAM: SKULL - COMPLETE 4 + VIEW COMPARISON:  None. FINDINGS: There is no evidence of skull fracture or other focal bone lesions. Limited by positioning. IMPRESSION: Negative. Electronically Signed   By: Jasmine Pang M.D.   On: 02/08/2017 22:16   Ct Head Wo Contrast  Result Date: 02/08/2017 CLINICAL DATA:  Difficulty breathing, altered level of consciousness EXAM: CT HEAD WITHOUT CONTRAST TECHNIQUE: Contiguous axial images were obtained from the base of the skull through the vertex without intravenous contrast. COMPARISON:  07/03/2014 FINDINGS: Brain: Extensive hypodensity in the bilateral white matter, presumably  due to small vessel ischemic changes, similar compared to the prior study. Stable ventricle size. No hemorrhage or mass. Vascular: No hyperdense vessels.  Carotid artery  calcifications. Skull: No fracture or suspicious bone lesion Sinuses/Orbits: No acute finding. Other: None IMPRESSION: 1. No CT evidence for acute intracranial abnormality. 2. Marked hypodensity in the bilateral white matter which may be secondary to small vessel disease or other white matter disease. Electronically Signed   By: Jasmine Pang M.D.   On: 02/08/2017 15:51   Mr Laqueta Jean And Wo Contrast  Result Date: 02/09/2017 CLINICAL DATA:  Confusion, urinary incontinence. History of alcoholism, dementia. Assess for Wernicke's encephalopathy. EXAM: MRI HEAD WITHOUT AND WITH CONTRAST TECHNIQUE: Multiplanar, multiecho pulse sequences of the brain and surrounding structures were obtained without and with intravenous contrast. CONTRAST:  10mL MULTIHANCE GADOBENATE DIMEGLUMINE 529 MG/ML IV SOLN COMPARISON:  CT HEAD February 08, 2017 FINDINGS: BRAIN: No reduced diffusion to suggest acute ischemia. A few scattered chronic micro hemorrhages. The ventricles and sulci are normal for patient's age. Confluent supratentorial white matter FLAIR T2 hyperintensities. 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 Small area LEFT occipital lobe and LEFT frontal lobe encephalomalacia. No abnormal intraparenchymal or extra-axial enhancement. No abnormal extra-axial fluid collections. VASCULAR: Normal major intracranial vascular flow voids present at skull base. SKULL AND UPPER CERVICAL SPINE: No abnormal sellar expansion. No suspicious calvarial bone marrow signal. Craniocervical junction maintained. SINUSES/ORBITS: Trace paranasal sinus mucosal thickening. Mastoid air cells are well aerated. The included ocular globes and orbital contents are non-suspicious. OTHER: None. IMPRESSION: 1. No acute intracranial process. 2. Moderate to severe chronic small vessel ischemic disease. 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 3. Focal LEFT frontal and and LEFT occipital lobe encephalomalacia favoring  posttraumatic etiology. Electronically Signed   By: Awilda Metro M.D.   On: 02/09/2017 03:08   Dg Chest Port 1 View  Result Date: 02/08/2017 CLINICAL DATA:  Shortness of breath today in a patient with COPD. EXAM: PORTABLE CHEST 1 VIEW COMPARISON:  PA and lateral chest 05/22/2016 and 08/02/2011. CT chest 06/06/2008. FINDINGS: The chest is hyperexpanded with attenuation of the pulmonary vasculature. Lungs are clear. No pneumothorax or pleural fluid. No bony abnormality. IMPRESSION: Emphysema without acute disease. Electronically Signed   By: Drusilla Kanner M.D.   On: 02/08/2017 14:41     ASSESSMENT AND PLAN:   71 year old male with past medical history of COPD, alcohol abuse, who presented to the hospital due to altered mental status.  1. Altered mental status-multifactorial in nature. 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 -Mental status much improved after treatment for UTI.  2. Urinary tract infection-continue IV Rocephin, follow urine cultures which are negative so far.  3. ETOH abuse - cont. CIWA.  Cont. Thiamine, Folate.   4. Hypophosphotemia - will place on Oral K-phos and repeat in a.m.   PT recommending rehab and will there likely tomorrow if mental status stable.   All the records are reviewed and case discussed with Care Management/Social Worker. Management plans discussed with the patient, family and they are in agreement.  CODE STATUS: Full code  DVT Prophylaxis: Hep. SQ  TOTAL TIME TAKING CARE OF THIS PATIENT: 30 minutes.   POSSIBLE D/C IN 1-2 DAYS, DEPENDING ON CLINICAL CONDITION.   Houston Siren M.D on 02/10/2017 at 1:53 PM  Between 7am to 6pm - Pager - [phone]  After 6pm go to [email or web address] - password [email or web address] [facility]  [facility]  [facility]  Office  [phone]  CC: Primary care physician; [facility]

## 2017-02-11 LAB — BASIC METABOLIC PANEL
ANION GAP: 7 (ref 5–15)
BUN: 11 mg/dL (ref 6–20)
CO2: 31 mmol/L (ref 22–32)
Calcium: 9 mg/dL (ref 8.9–10.3)
Chloride: 100 mmol/L — ABNORMAL LOW (ref 101–111)
Creatinine, Ser: 0.74 mg/dL (ref 0.61–1.24)
GFR calc Af Amer: >60 mL/min (ref >60)
GFR calc non Af Amer: >60 mL/min (ref >60)
GLUCOSE: 105 mg/dL — AB (ref 65–99)
POTASSIUM: 3.6 mmol/L (ref 3.5–5.1)
Sodium: 138 mmol/L (ref 135–145)

## 2017-02-11 LAB — MAGNESIUM: MAGNESIUM: 1.5 mg/dL — AB (ref 1.7–2.4)

## 2017-02-11 LAB — URINE CULTURE

## 2017-02-11 LAB — PHOSPHORUS: Phosphorus: 2.7 mg/dL (ref 2.5–4.6)

## 2017-02-11 MED ORDER — MAGNESIUM SULFATE 2 GM/50ML IV SOLN
2.0000 g | Freq: Once | INTRAVENOUS | Status: AC
  Administered 2017-02-11: 09:00:00 2 g via INTRAVENOUS
  Filled 2017-02-11: qty 50

## 2017-02-11 MED ORDER — CEFUROXIME AXETIL 250 MG PO TABS: 250.0000 mg | ORAL_TABLET | Freq: Two times a day (BID) | ORAL | 0 refills | Status: AC

## 2017-02-11 MED ORDER — FOLIC ACID 1 MG PO TABS: 1.0000 mg | ORAL_TABLET | Freq: Every day | ORAL | Status: DC

## 2017-02-11 MED ORDER — CLONIDINE HCL 0.1 MG PO TABS: 0.1000 mg | ORAL_TABLET | Freq: Two times a day (BID) | ORAL | 11 refills | Status: DC

## 2017-02-11 MED ORDER — OXYCODONE-ACETAMINOPHEN 5-325 MG PO TABS: 1.0000 | ORAL_TABLET | Freq: Four times a day (QID) | ORAL | 0 refills | Status: DC | PRN

## 2017-02-11 MED ORDER — DOCUSATE SODIUM 100 MG PO CAPS: 100.0000 mg | ORAL_CAPSULE | Freq: Two times a day (BID) | ORAL | 0 refills | Status: DC | PRN

## 2017-02-11 MED ORDER — ADULT MULTIVITAMIN W/MINERALS CH: 1.0000 | ORAL_TABLET | Freq: Every day | ORAL | Status: DC

## 2017-02-11 NOTE — Progress Notes (Addendum)
Patient is medically stable for D/C to [facility] today. Per [name] liaison patient can come today to room 504. RN will call report to RN Elly ModenaKathy Rogers at [phone] and arrange EMS for transport. Clinical Child psychotherapistocial Worker (CSW) sent D/C orders to [facility] via [facility]. CSW attempted to contact patient's daughter [name] however she did not answer and a voicemail was left. CSW also attempted to contact patient's daughter [name] however she did not answer and her voicemail was not set up. Please reconsult if future social work needs arise. CSW signing off.   Patient's daughter [name] called CSW back and was made aware of D/C today.   Baker Hughes IncorporatedBailey Conita Amenta, LCSW [phone]

## 2017-02-11 NOTE — [place] FL2 (Signed)
[place] MEDICAID FL2 LEVEL OF CARE SCREENING TOOL     IDENTIFICATION  Patient Name: [name] [name] Birthdate: [birth year] Sex: male Admission Date (Current Location): 02/08/2017  [facility] and [place] Number:  [name]  ([id number]) Facility and Address:  [facility], [address], [place], [place] [address]      Provider Number: [id number]  Attending Physician Name and Address:  Houston Siren, MD  Relative Name and Phone Number:       Current Level of Care: Hospital Recommended Level of Care: Skilled Nursing Facility Prior Approval Number:    Date Approved/Denied:   PASRR Number:  [id number])  Discharge Plan: SNF    Current Diagnoses: Patient Active Problem List   Diagnosis Date Noted  . Sepsis (HCC) 02/08/2017  . UTI (urinary tract infection) 02/08/2017  . Alcoholism (HCC) 02/08/2017    Orientation RESPIRATION BLADDER Height & Weight     Self  Normal Incontinent Weight: 117 lb 3.2 oz (53.2 kg) Height:  5\' 8"  (172.7 cm)  BEHAVIORAL SYMPTOMS/MOOD NEUROLOGICAL BOWEL NUTRITION STATUS      Continent Diet (Diet: Heart Healthy )  AMBULATORY STATUS COMMUNICATION OF NEEDS Skin   Extensive Assist Verbally Normal                       Personal Care Assistance Level of Assistance  Bathing, Feeding, Dressing Bathing Assistance: Limited assistance Feeding assistance: Independent Dressing Assistance: Limited assistance     Functional Limitations Info  Sight, Hearing, Speech Sight Info: Adequate Hearing Info: Adequate Speech Info: Adequate    SPECIAL CARE FACTORS FREQUENCY  PT (By licensed PT), OT (By licensed OT)     PT Frequency:  (5) OT Frequency:  (5)            Contractures      Additional Factors Info  Code Status, Allergies Code Status Info:  (Full Code. ) Allergies Info:  (No Known Allergies. )           Current Medications (02/11/2017):  This is the current hospital active medication list Current  Facility-Administered Medications  Medication Dose Route Frequency Provider Last Rate Last Dose  . acetaminophen (TYLENOL) tablet 650 mg  650 mg Oral Q6H PRN Shaune Pollack, MD      . albuterol (PROVENTIL) (2.5 MG/3ML) 0.083% nebulizer solution 2.5 mg  2.5 mg Inhalation Q6H PRN Altamese Dilling, MD   2.5 mg at 02/10/17 2107  . bisacodyl (DULCOLAX) EC tablet 5 mg  5 mg Oral Daily PRN Shaune Pollack, MD      . cefTRIAXone (ROCEPHIN) 1 g in dextrose 5 % 50 mL IVPB  1 g Intravenous Q24H Altamese Dilling, MD   Stopped at 02/10/17 2126  . docusate sodium (COLACE) capsule 100 mg  100 mg Oral BID PRN Altamese Dilling, MD      . feeding supplement (ENSURE ENLIVE) (ENSURE ENLIVE) liquid 237 mL  237 mL Oral TID BM Shaune Pollack, MD   237 mL at 02/11/17 0917  . folic acid (FOLVITE) tablet 1 mg  1 mg Oral Daily Altamese Dilling, MD   1 mg at 02/11/17 0916  . heparin injection 5,000 Units  5,000 Units Subcutaneous Q8H Altamese Dilling, MD   5,000 Units at 02/11/17 0511  . LORazepam (ATIVAN) tablet 1 mg  1 mg Oral Q6H PRN Altamese Dilling, MD   1 mg at 02/11/17 0916   Or  . LORazepam (ATIVAN) injection 1 mg  1 mg Intravenous Q6H PRN Altamese Dilling, MD  1 mg at 02/11/17 0137  . morphine 2 MG/ML injection 2 mg  2 mg Intravenous Q4H PRN Shaune Pollackhen, Qing, MD      . multivitamin with minerals tablet 1 tablet  1 tablet Oral Daily Altamese DillingVachhani, Vaibhavkumar, MD   1 tablet at 02/11/17 0916  . ondansetron (ZOFRAN) injection 4 mg  4 mg Intravenous Q6H PRN Shaune Pollackhen, Qing, MD      . oxyCODONE-acetaminophen (PERCOCET/ROXICET) 5-325 MG per tablet 1 tablet  1 tablet Oral Q6H PRN Shaune Pollackhen, Qing, MD      . thiamine (B-1) injection 250 mg  250 mg Intravenous Daily Shaune Pollackhen, Qing, MD   250 mg at 02/11/17 [id number]     Discharge Medications: Please see discharge summary for a list of discharge medications.  Relevant Imaging Results:  Relevant Lab Results:   Additional Information  (SSN: [id number])  Eura Mccauslin,  Darleen CrockerBailey M, LCSW

## 2017-02-11 NOTE — Progress Notes (Signed)
Pt for discharge [facility] via [facility]s . Alert but  With confusion at times.  Chair  thia am. No resp distress.  Sl d/cd. Report called to  Chubb CorporationSandra rogers  At [facility].  Pt ready for discharge and ems  Called at this time.

## 2017-02-11 NOTE — Progress Notes (Signed)
MEDICATION RELATED CONSULT NOTE  Pharmacy Consult for Electrolytes  Indication: low phos  No Known Allergies  Patient Measurements: Height: 5\' 8"  (172.7 cm) Weight: 117 lb 3.2 oz (53.2 kg) IBW/kg (Calculated) : 68.4  Vital Signs: Temp: 98 F (36.7 C) (10/26 0606) BP: 155/74 (10/26 0606) Pulse Rate: 70 (10/26 0606) Intake/Output from previous day: 10/25 0701 - 10/26 0700 In: 600 [P.O.:600] Out: 250 [Urine:250] Intake/Output from this shift: No intake/output data recorded.  Labs:  Recent Labs  02/08/17 1410 02/09/17 0445 02/10/17 0436 02/10/17 0443 02/11/17 0446  WBC 14.0* 12.2*  --   --   --   HGB 14.0 11.9*  --   --   --   HCT 43.9 36.3*  --   --   --   PLT 200 178  --   --   --   CREATININE 1.01 0.82 1.01  --  0.74  MG  --   --   --  1.9 1.5*  PHOS  --   --   --  1.9* 2.7   Estimated Creatinine Clearance: 64.7 mL/min (by C-G formula based on SCr of 0.74 mg/dL).  Assessment: 71 yo male with low phos consulted to replace and monitor electrolytes.   Phos 1.9, Mag 1.9  10/26: K 3.6, Phos 2.7, Mag 1.5  Goal of Therapy:  Electrolytes WNL  Plan:  Will order Magnesium sulfate 2 gram IV x 1. f/u labs in AM  Pharmacy will continue to follow.   Johnye Kist A 02/11/2017,8:22 AM

## 2017-02-11 NOTE — Discharge Summary (Signed)
[facility] [employer] - [place] at [facility]   PATIENT NAME: [name] [name]    MR#:  [id number]  DATE OF BIRTH:  27-May-1945  DATE OF ADMISSION:  02/08/2017 ADMITTING PHYSICIAN: Altamese DillingVaibhavkumar Vachhani, MD  DATE OF DISCHARGE: 02/11/2017  PRIMARY CARE PHYSICIAN: [facility]    ADMISSION DIAGNOSIS:  Alcoholic ketoacidosis [E87.2] Alcoholism (HCC) [F10.20] Confusion [R41.0] COPD exacerbation (HCC) [J44.1] Failure to thrive in adult [R62.7] Acute cystitis without hematuria [N30.00]  DISCHARGE DIAGNOSIS:  Principal Problem:   Sepsis (HCC) Active Problems:   UTI (urinary tract infection)   Alcoholism (HCC)   SECONDARY DIAGNOSIS:   Past Medical History:  Diagnosis Date  . COPD (chronic obstructive pulmonary disease) (HCC)   . Male erectile disorder     HOSPITAL COURSE:   71 year old male with past medical history of COPD, alcohol abuse, who presented to the hospital due to altered mental status.  1. Altered mental status- this was multifactorial in nature. 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 - After treatment for the urinary tract infection with IV antibiotics patient's mental status has improved.  He is close to baseline now and therefore being discharged.  He was seen by physical therapy and they recommended short-term rehab which is where he is presently being discharged.  2. Urinary tract infection- patient was treated with IV ceftriaxone.  His urine cultures are positive for Klebsiella which is sensitive to ceftriaxone.  Patient is now being discharged on an additional few more days of oral cefuroxime.  3. ETOH abuse -patient was maintained on CIWA protocol.  He presently is not going through alcohol withdrawal.  He will continue oral thiamine and folate.    4. Hypophosphotemia -this has been replaced and improved.  5.  Hypomagnesemia-patient also received magnesium  supplements and this has improved and resolved too.   6.  COPD-patient had no acute exacerbation -Patient will continue albuterol inhaler as needed.  7. HTN - pt. Was discharged on low dose Clonidine.   Patient is being discharged to a skilled nursing facility for ongoing care.  DISCHARGE CONDITIONS:   Stable.   CONSULTS OBTAINED:    DRUG ALLERGIES:  No Known Allergies  DISCHARGE MEDICATIONS:   Allergies as of 02/11/2017   No Known Allergies     Medication List    TAKE these medications   albuterol 108 (90 Base) MCG/ACT inhaler Commonly known as:  PROVENTIL HFA;VENTOLIN HFA Inhale 2 puffs into the lungs every 6 (six) hours as needed for wheezing or shortness of breath.   cefUROXime 250 MG tablet Commonly known as:  CEFTIN Take 1 tablet (250 mg total) by mouth 2 (two) times daily with a meal.   cloNIDine 0.1 MG tablet Commonly known as:  CATAPRES Take 1 tablet (0.1 mg total) by mouth 2 (two) times daily.   docusate sodium 100 MG capsule Commonly known as:  COLACE Take 1 capsule (100 mg total) by mouth 2 (two) times daily as needed for mild constipation.   folic acid 1 MG tablet Commonly known as:  FOLVITE Take 1 tablet (1 mg total) by mouth daily.   multivitamin with minerals Tabs tablet Take 1 tablet by mouth daily.   oxyCODONE-acetaminophen 5-325 MG tablet Commonly known as:  PERCOCET/ROXICET Take 1 tablet by mouth every 6 (six) hours as needed for moderate pain.         DISCHARGE INSTRUCTIONS:   DIET:  Cardiac diet  DISCHARGE CONDITION:  Stable  ACTIVITY:  Activity as tolerated  OXYGEN:  Home Oxygen: No.   Oxygen Delivery: room air  DISCHARGE LOCATION:  nursing home   If you experience worsening of your admission symptoms, develop shortness of breath, life threatening emergency, suicidal or homicidal thoughts you must seek medical attention immediately by calling 911 or calling your MD immediately  if symptoms less severe.  You Must  read complete instructions/literature along with all the possible adverse reactions/side effects for all the Medicines you take and that have been prescribed to you. Take any new Medicines after you have completely understood and accpet all the possible adverse reactions/side effects.   Please note  You were cared for by a hospitalist during your hospital stay. If you have any questions about your discharge medications or the care you received while you were in the hospital after you are discharged, you can call the unit and asked to speak with the hospitalist on call if the hospitalist that took care of you is not available. Once you are discharged, your primary care physician will handle any further medical issues. Please note that NO REFILLS for any discharge medications will be authorized once you are discharged, as it is imperative that you return to your primary care physician (or establish a relationship with a primary care physician if you do not have one) for your aftercare needs so that they can reassess your need for medications and monitor your lab values.     Today   No acute events overnight.  Patient denies any chest pains, shortness of breath, sitting up in chair and eating breakfast.  VITAL SIGNS:  Blood pressure (!) 157/86, pulse 76, temperature 98.2 F (36.8 C), temperature source Oral, resp. rate 18, height 5\' 8"  (1.727 m), weight 53.2 kg (117 lb 3.2 oz), SpO2 96 %.  I/O:   Intake/Output Summary (Last 24 hours) at 02/11/17 1034 Last data filed at 02/10/17 2157  Gross per 24 hour  Intake              240 ml  Output              250 ml  Net              -10 ml    PHYSICAL EXAMINATION:   GENERAL:  71 y.o.-year-old patient sitting in chair eating breakfast in no acute distress.  EYES: Pupils equal, round, reactive to light and accommodation. No scleral icterus. Extraocular muscles intact.  HEENT: Head atraumatic, normocephalic. Oropharynx and nasopharynx clear.  NECK:   Supple, no jugular venous distention. No thyroid enlargement, no tenderness.  LUNGS: Normal breath sounds bilaterally, no wheezing, rales, rhonchi. No use of accessory muscles of respiration.  CARDIOVASCULAR: S1, S2 normal. No murmurs, rubs, or gallops.  ABDOMEN: Soft, nontender, nondistended. Bowel sounds present. No organomegaly or mass.  EXTREMITIES: No cyanosis, clubbing or edema b/l.    NEUROLOGIC: Cranial nerves II through XII are intact. No focal Motor or sensory deficits b/l.  Globally weak.  PSYCHIATRIC: The patient is alert and oriented x 1.  SKIN: No obvious rash, lesion, or ulcer.   DATA REVIEW:   CBC  Recent Labs Lab 02/09/17 0445  WBC 12.2*  HGB 11.9*  HCT 36.3*  PLT 178    Chemistries   Recent Labs Lab 02/11/17 0446  NA 138  K 3.6  CL 100*  CO2 31  GLUCOSE 105*  BUN 11  CREATININE 0.74  CALCIUM 9.0  MG 1.5*    Cardiac Enzymes  Recent Labs  Lab 02/08/17 1410  TROPONINI 0.03*    Microbiology Results  Results for orders placed or performed during the hospital encounter of 02/08/17  Urine culture     Status: Abnormal   Collection Time: 02/08/17  3:20 PM  Result Value Ref Range Status   Specimen Description URINE, RANDOM  Final   Special Requests NONE  Final   Culture >=100,000 COLONIES/mL KLEBSIELLA PNEUMONIAE (A)  Final   Report Status 02/11/2017 FINAL  Final   Organism ID, Bacteria KLEBSIELLA PNEUMONIAE (A)  Final      Susceptibility   Klebsiella pneumoniae - MIC*    AMPICILLIN >=32 RESISTANT Resistant     CEFAZOLIN <=4 SENSITIVE Sensitive     CEFTRIAXONE <=1 SENSITIVE Sensitive     CIPROFLOXACIN <=0.25 SENSITIVE Sensitive     GENTAMICIN <=1 SENSITIVE Sensitive     IMIPENEM <=0.25 SENSITIVE Sensitive     NITROFURANTOIN 64 INTERMEDIATE Intermediate     TRIMETH/SULFA <=20 SENSITIVE Sensitive     AMPICILLIN/SULBACTAM >=32 RESISTANT Resistant     PIP/TAZO <=4 SENSITIVE Sensitive     Extended ESBL NEGATIVE Sensitive     * >=100,000  COLONIES/mL KLEBSIELLA PNEUMONIAE    RADIOLOGY:  No results found.    Management plans discussed with the patient, family and they are in agreement.  CODE STATUS:     Code Status Orders        Start     Ordered   02/09/17 0051  Full code  Continuous     02/09/17 0050    Code Status History    Date Active Date Inactive Code Status Order ID Comments User Context   This patient has a current code status but no historical code status.      TOTAL TIME TAKING CARE OF THIS PATIENT: 40 minutes.    Houston Siren M.D on 02/11/2017 at 10:34 AM  Between 7am to 6pm - Pager - [phone]  After 6pm go to [email or web address] - Therapist, nutritional Hospitalists  Office  [phone]  CC: Primary care physician; [facility]

## 2017-02-11 NOTE — Clinical Social Work Placement (Signed)
   CLINICAL SOCIAL WORK PLACEMENT  NOTE  Date:  02/11/2017  Patient Details  Name: [name] [name] MRN: [id number] Date of Birth: January 17, 1946  Clinical Social Work is seeking post-discharge placement for this patient at the Skilled  Nursing Facility level of care (*CSW will initial, date and re-position this form in  chart as items are completed):  Yes   Patient/family provided with [place] Clinical Social Work Department's list of facilities offering this level of care within the geographic area requested by the patient (or if unable, by the patient's family).  Yes   Patient/family informed of their freedom to choose among providers that offer the needed level of care, that participate in Medicare, Medicaid or managed care program needed by the patient, have an available bed and are willing to accept the patient.  Yes   Patient/family informed of [facility]'s ownership interest in [facility] and [facility], as well as of the fact that they are under no obligation to receive care at these facilities.  PASRR submitted to EDS on 02/09/17     PASRR number received on 02/09/17     Existing PASRR number confirmed on       FL2 transmitted to all facilities in geographic area requested by pt/family on 02/09/17     FL2 transmitted to all facilities within larger geographic area on       Patient informed that his/her managed care company has contracts with or will negotiate with certain facilities, including the following:        Yes   Patient/family informed of bed offers received.  Patient chooses bed at  ([facility] )     Physician recommends and patient chooses bed at      Patient to be transferred to  ([facility] ) on 02/11/17.  Patient to be transferred to facility by  [facility] )     Patient family notified on 02/11/17 of transfer.  Name of family member notified:   (CSW left patient's daughter [name] a Engineer, technical salesvoicemail. )     PHYSICIAN       Additional Comment:     _______________________________________________ Dalis Beers, Darleen CrockerBailey M, LCSW 02/11/2017, 11:50 AM

## 2017-04-05 ENCOUNTER — Other Ambulatory Visit: Payer: Self-pay

## 2017-04-05 ENCOUNTER — Encounter: Payer: Self-pay | Admitting: Emergency Medicine

## 2017-04-05 ENCOUNTER — Emergency Department
Admission: EM | Admit: 2017-04-05 | Discharge: 2017-04-05 | Disposition: A | Discharge status: Home / Self Care | Payer: Medicare Other | Attending: Emergency Medicine | Admitting: Emergency Medicine

## 2017-04-05 DIAGNOSIS — R8290 Unspecified abnormal findings in urine: Secondary | ICD-10-CM | POA: Diagnosis present

## 2017-04-05 DIAGNOSIS — Z79899 Other long term (current) drug therapy: Secondary | ICD-10-CM | POA: Insufficient documentation

## 2017-04-05 DIAGNOSIS — J449 Chronic obstructive pulmonary disease, unspecified: Secondary | ICD-10-CM | POA: Insufficient documentation

## 2017-04-05 DIAGNOSIS — F17210 Nicotine dependence, cigarettes, uncomplicated: Secondary | ICD-10-CM | POA: Insufficient documentation

## 2017-04-05 DIAGNOSIS — F0281 Dementia in other diseases classified elsewhere with behavioral disturbance: Secondary | ICD-10-CM | POA: Insufficient documentation

## 2017-04-05 DIAGNOSIS — N3000 Acute cystitis without hematuria: Secondary | ICD-10-CM | POA: Insufficient documentation

## 2017-04-05 DIAGNOSIS — G309 Alzheimer's disease, unspecified: Secondary | ICD-10-CM | POA: Insufficient documentation

## 2017-04-05 DIAGNOSIS — E512 Wernicke's encephalopathy: Secondary | ICD-10-CM | POA: Diagnosis not present

## 2017-04-05 LAB — URINALYSIS, COMPLETE (UACMP) WITH MICROSCOPIC
Bilirubin Urine: NEGATIVE
GLUCOSE, UA: NEGATIVE mg/dL
Ketones, ur: NEGATIVE mg/dL
Nitrite: POSITIVE — AB
PH: 5 (ref 5.0–8.0)
PROTEIN: 30 mg/dL — AB
Specific Gravity, Urine: 1.024 (ref 1.005–1.030)

## 2017-04-05 MED ORDER — SULFAMETHOXAZOLE-TRIMETHOPRIM 800-160 MG PO TABS: 1.0000 | ORAL_TABLET | Freq: Two times a day (BID) | ORAL | 0 refills | Status: DC

## 2017-04-05 MED ORDER — ALPRAZOLAM 0.5 MG PO TABS: 0.5000 mg | ORAL_TABLET | Freq: Two times a day (BID) | ORAL | 0 refills | Status: AC | PRN

## 2017-04-05 MED ORDER — CEFTRIAXONE SODIUM 1 G IJ SOLR
1.0000 g | Freq: Once | INTRAMUSCULAR | Status: AC
  Administered 2017-04-05: 1 g via INTRAMUSCULAR
  Filled 2017-04-05: qty 10

## 2017-04-05 MED ORDER — SULFAMETHOXAZOLE-TRIMETHOPRIM 800-160 MG PO TABS
1.0000 | ORAL_TABLET | Freq: Once | ORAL | Status: AC
  Administered 2017-04-05: 1 via ORAL
  Filled 2017-04-05: qty 1

## 2017-04-05 NOTE — ED Notes (Signed)
Patient denies pain and is resting comfortably.  

## 2017-04-05 NOTE — ED Notes (Signed)
Family declined discharged vital signs.

## 2017-04-05 NOTE — ED Notes (Signed)
Patient ambulates with a steady gait. Patient is pleasant, appears confused. Daughter at bedside.

## 2017-04-05 NOTE — ED Provider Notes (Signed)
[facility] Emergency Department Provider Note  ____________________________________________  Time seen: Approximately 7:44 PM  I have reviewed the triage vital signs and the nursing notes.   HISTORY  Chief Complaint Urine test  Patient has severe Alzheimer's, Warnicke's encephalopathy, thiamine deficiency.  Patient is a very poor historian.  Patient's HPI is provided by the patient's daughter who is his guardian.  HPI [name] [name] is a 71 y.o. male who presents the emergency department with his daughter for complaint of rechecking his urine.  Patient has a significant history of Alzheimer's, Warnicke's encephalopathy, thiamine deficiency.  Patient has chronic memory issues.  The daughter reports that patient had a UTI approximately 2 months ago and was hospitalized.  Patient has not complained of any problems but she has caught him several times within the last week to 2 weeks collecting his urine in a bottle stating he forgot to give it to my doctor."  Daughter is requesting that we check the patient's urine to ensure that he does not have a repeat UTI.  Daughter also reports that patient has been somewhat volatile with his Alzheimer's.  Patient wants to go out "get a job" and when he is told that he cannot go out and do things such as drive, work, he becomes very volatile.  The daughter reports that she has an appointment at the end of the week to discuss the patient's progress with his primary care.  She is requesting a medication to prevent these outbreaks on an as-needed basis until she can follow-up with primary care.  Patient denies any complaints at this time.  Patient is happy, agreeable with her daughter and provider.  Patient is unable to answer basic questions regarding his care and any symptoms.  When asked about any pain he denies any complaints.  Past Medical History:  Diagnosis Date  . COPD (chronic obstructive pulmonary disease) (HCC)   . Male  erectile disorder     Patient Active Problem List   Diagnosis Date Noted  . Sepsis (HCC) 02/08/2017  . UTI (urinary tract infection) 02/08/2017  . Alcoholism (HCC) 02/08/2017    Past Surgical History:  Procedure Laterality Date  . KNEE SURGERY Right     Prior to Admission medications   Medication Sig Start Date End Date Taking? Authorizing Provider  albuterol (PROVENTIL HFA;VENTOLIN HFA) 108 (90 Base) MCG/ACT inhaler Inhale 2 puffs into the lungs every 6 (six) hours as needed for wheezing or shortness of breath. 05/22/16   Nita Sickle, MD  ALPRAZolam Prudy Feeler) 0.5 MG tablet Take 1 tablet (0.5 mg total) by mouth 2 (two) times daily as needed for anxiety (Increased agitation). 04/05/17 04/05/18  Tylik Treese, Delorise Royals, PA-C  cloNIDine (CATAPRES) 0.1 MG tablet Take 1 tablet (0.1 mg total) by mouth 2 (two) times daily. 02/11/17   Houston Siren, MD  docusate sodium (COLACE) 100 MG capsule Take 1 capsule (100 mg total) by mouth 2 (two) times daily as needed for mild constipation. 02/11/17   Houston Siren, MD  folic acid (FOLVITE) 1 MG tablet Take 1 tablet (1 mg total) by mouth daily. 02/12/17   Houston Siren, MD  Multiple Vitamin (MULTIVITAMIN WITH MINERALS) TABS tablet Take 1 tablet by mouth daily. 02/12/17   Houston Siren, MD  oxyCODONE-acetaminophen (PERCOCET/ROXICET) 5-325 MG tablet Take 1 tablet by mouth every 6 (six) hours as needed for moderate pain. 02/11/17   Houston Siren, MD  sulfamethoxazole-trimethoprim (BACTRIM DS,SEPTRA DS) 800-160 MG tablet Take 1 tablet by mouth  2 (two) times daily. 04/05/17   Amal Renbarger, Delorise RoyalsJonathan D, PA-C    Allergies Patient has no known allergies.  Family History  Problem Relation Age of Onset  . Hypertension Mother     Social History Social History   Tobacco Use  . Smoking status: Current Some Day Smoker    Packs/day: 0.25  . Smokeless tobacco: Never Used  Substance Use Topics  . Alcohol use: Yes    Alcohol/week: 16.8 oz     Types: 28 Cans of beer per week  . Drug use: No     Review of Systems  Constitutional: No fever/chills Eyes: No visual changes. No discharge ENT: No upper respiratory complaints. Cardiovascular: no chest pain. Respiratory: no cough. No SOB. Gastrointestinal: No abdominal pain.  No nausea, no vomiting.  No diarrhea.  No constipation. Genitourinary: Patient denies any dysuria or hematuria.  Daughter reports previous UTI 2 months ago.  Patient does not have any of the symptoms he previously had but she is concerned as he has been "catching his urine in a jar saying that I need to give it to my doctor." Musculoskeletal: Negative for musculoskeletal pain. Skin: Negative for rash, abrasions, lacerations, ecchymosis. Neurological: Negative for headaches, focal weakness or numbness. Psychological: Patient with a history of dementia, Alzheimer's, Wernicke's encephalopathy.  Patient has had some irritability when told that he cannot go out and drive, work.  Daughter denies that the patient has endorse any suicidal or homicidal 10-point ROS otherwise negative.  ____________________________________________   PHYSICAL EXAM:  VITAL SIGNS: ED Triage Vitals  Enc Vitals Group     BP 04/05/17 1942 117/77     Pulse Rate 04/05/17 1942 76     Resp 04/05/17 1942 18     Temp 04/05/17 1942 98.4 F (36.9 C)     Temp Source 04/05/17 1942 Oral     SpO2 04/05/17 1942 96 %     Weight 04/05/17 1938 135 lb (61.2 kg)     Height 04/05/17 1938 5\' 7"  (1.702 m)     Head Circumference --      [facility] Flow --      Pain Score --      Pain Loc --      Pain Edu? --      Excl. in GC? --      Constitutional: Alert and oriented. Well appearing and in no acute distress. Eyes: Conjunctivae are normal. PERRL. EOMI. Head: Atraumatic. ENT:      Ears:       Nose: No congestion/rhinnorhea.      Mouth/Throat: Mucous membranes are moist.  Neck: No stridor.  Neck is supple full range of motion  Cardiovascular: Normal  rate, regular rhythm. Normal S1 and S2.  Good peripheral circulation. Respiratory: Normal respiratory effort without tachypnea or retractions. Lungs CTAB. Good air entry to the bases with no decreased or absent breath sounds. Gastrointestinal: Bowel sounds 4 quadrants. Soft and nontender to palpation. No guarding or rigidity. No palpable masses. No distention. No CVA tenderness. Musculoskeletal: Full range of motion to all extremities. No gross deformities appreciated. Neurologic:  Normal speech and language. No gross focal neurologic deficits are appreciated.  Skin:  Skin is warm, dry and intact. No rash noted. Psychiatric: Mood and affect are normal. Speech and behavior are normal.  Patient is very confused, unable to answer basic questions.  Consistent with Alzheimer's.  Patient is pleasant, interacting well with daughter and provider in the room.  Patient denies any intent of harming himself or  others to the provider.   ____________________________________________   LABS (all labs ordered are listed, but only abnormal results are displayed)  Labs Reviewed  URINALYSIS, COMPLETE (UACMP) WITH MICROSCOPIC - Abnormal; Notable for the following components:      Result Value   Color, Urine YELLOW (*)    APPearance CLOUDY (*)    Hgb urine dipstick LARGE (*)    Protein, ur 30 (*)    Nitrite POSITIVE (*)    Leukocytes, UA MODERATE (*)    Bacteria, UA MANY (*)    Squamous Epithelial / LPF 0-5 (*)    All other components within normal limits  URINE CULTURE   ____________________________________________  EKG   ____________________________________________  RADIOLOGY   No results found.  ____________________________________________    PROCEDURES  Procedure(s) performed:    Procedures    Medications  cefTRIAXone (ROCEPHIN) injection 1 g (not administered)  sulfamethoxazole-trimethoprim (BACTRIM DS,SEPTRA DS) 800-160 MG per tablet 1 tablet (not administered)      ____________________________________________   INITIAL IMPRESSION / ASSESSMENT AND PLAN / ED COURSE  Pertinent labs & imaging results that were available during my care of the patient were reviewed by me and considered in my medical decision making (see chart for details).  Review of the [place] CSRS was performed in accordance of the [facility] prior to dispensing any controlled drugs.     Patient's diagnosis is consistent with UTI, Alzheimer's, Warnicke's encephalopathy.  Patient's diagnosis of Wernicke's and Alzheimer's is an ongoing complaint.  Patient did have a UTI 2 months ago, was treated.  He has been keeping his urine sample I need to give it to the doctor."  Daughter was concerned that patient may have another UTI even though he did with dysuria or hematuria.  Urinalysis returns with the results of UTI.  Patient will be given Rocephin and Bactrim in the emergency department for UTI.  Patient is also been having some increased volatility and being told that he cannot do things that he used to do.  Patient daughter requested medication to be given as needed to help calm these events down.  I feel that these are likely increased due to infection but I will prescribe a very limited amount of Xanax should patient become very upset.  I have instructed the daughter to discuss all this with his primary care who he sees in 2 days.  She verbalizes understanding of same.. Patient will be discharged home with prescriptions for Bactrim and Xanax. Patient is to follow up with primary care as needed or otherwise directed. Patient is given ED precautions to return to the ED for any worsening or new symptoms.     ____________________________________________  FINAL CLINICAL IMPRESSION(S) / ED DIAGNOSES  Final diagnoses:  Acute cystitis without hematuria  Alzheimer's dementia with behavioral disturbance, unspecified timing of dementia onset  Wernicke's encephalopathy      NEW MEDICATIONS STARTED  DURING THIS VISIT:  ED Discharge Orders        Ordered    sulfamethoxazole-trimethoprim (BACTRIM DS,SEPTRA DS) 800-160 MG tablet  2 times daily     04/05/17 2036    ALPRAZolam (XANAX) 0.5 MG tablet  2 times daily PRN     04/05/17 2036          This chart was dictated using voice recognition software/Dragon. Despite best efforts to proofread, errors can occur which can change the meaning. Any change was purely unintentional.    Racheal PatchesCuthriell, Lorene Klimas D, PA-C 04/05/17 2039    Nita SickleVeronese, [place], MD 04/06/17  0028  

## 2017-04-05 NOTE — ED Notes (Signed)
Patient is sitting on stretcher, declined TV remote. Patient denies any complaints at this time. Patient's daughter is not at bedside at this time.

## 2017-04-05 NOTE — ED Triage Notes (Signed)
Patient to ER for a urine check. Family states patient had recent UTI. Want to check and make sure patient does not still have UTI. Patient does not have any urinary symptoms.

## 2017-04-08 LAB — URINE CULTURE
Culture: >=100000 — AB
Special Requests: NORMAL

## 2017-06-13 ENCOUNTER — Emergency Department
Admission: EM | Admit: 2017-06-13 | Discharge: 2017-06-13 | Disposition: A | Discharge status: Home / Self Care | Payer: Medicare Other | Attending: Emergency Medicine | Admitting: Emergency Medicine

## 2017-06-13 ENCOUNTER — Other Ambulatory Visit: Payer: Self-pay

## 2017-06-13 ENCOUNTER — Emergency Department: Payer: Medicare Other

## 2017-06-13 DIAGNOSIS — R0602 Shortness of breath: Secondary | ICD-10-CM | POA: Diagnosis present

## 2017-06-13 DIAGNOSIS — J449 Chronic obstructive pulmonary disease, unspecified: Secondary | ICD-10-CM | POA: Insufficient documentation

## 2017-06-13 DIAGNOSIS — Z79899 Other long term (current) drug therapy: Secondary | ICD-10-CM | POA: Insufficient documentation

## 2017-06-13 DIAGNOSIS — N390 Urinary tract infection, site not specified: Secondary | ICD-10-CM | POA: Diagnosis not present

## 2017-06-13 DIAGNOSIS — F17210 Nicotine dependence, cigarettes, uncomplicated: Secondary | ICD-10-CM | POA: Diagnosis not present

## 2017-06-13 LAB — URINALYSIS, ROUTINE W REFLEX MICROSCOPIC
BILIRUBIN URINE: NEGATIVE
Glucose, UA: NEGATIVE mg/dL
Ketones, ur: NEGATIVE mg/dL
NITRITE: POSITIVE — AB
Protein, ur: 30 mg/dL — AB
Specific Gravity, Urine: 1.024 (ref 1.005–1.030)
pH: 5 (ref 5.0–8.0)

## 2017-06-13 LAB — COMPREHENSIVE METABOLIC PANEL
ALBUMIN: 3.6 g/dL (ref 3.5–5.0)
ALK PHOS: 57 U/L (ref 38–126)
ALT: 11 U/L — ABNORMAL LOW (ref 17–63)
AST: 29 U/L (ref 15–41)
Anion gap: 10 (ref 5–15)
BILIRUBIN TOTAL: 1 mg/dL (ref 0.3–1.2)
BUN: 12 mg/dL (ref 6–20)
CO2: 27 mmol/L (ref 22–32)
Calcium: 9.2 mg/dL (ref 8.9–10.3)
Chloride: 102 mmol/L (ref 101–111)
Creatinine, Ser: 0.71 mg/dL (ref 0.61–1.24)
GFR calc Af Amer: >60 mL/min (ref >60)
GFR calc non Af Amer: >60 mL/min (ref >60)
GLUCOSE: 142 mg/dL — AB (ref 65–99)
POTASSIUM: 4.2 mmol/L (ref 3.5–5.1)
Sodium: 139 mmol/L (ref 135–145)
TOTAL PROTEIN: 7.5 g/dL (ref 6.5–8.1)

## 2017-06-13 LAB — MAGNESIUM: Magnesium: 1.8 mg/dL (ref 1.7–2.4)

## 2017-06-13 LAB — URINE DRUG SCREEN, QUALITATIVE (ARMC ONLY)
Amphetamines, Ur Screen: NOT DETECTED
BARBITURATES, UR SCREEN: NOT DETECTED
BENZODIAZEPINE, UR SCRN: NOT DETECTED
CANNABINOID 50 NG, UR ~~LOC~~: NOT DETECTED
Cocaine Metabolite,Ur ~~LOC~~: NOT DETECTED
MDMA (ECSTASY) UR SCREEN: NOT DETECTED
Methadone Scn, Ur: NOT DETECTED
Opiate, Ur Screen: NOT DETECTED
PHENCYCLIDINE (PCP) UR S: NOT DETECTED
TRICYCLIC, UR SCREEN: NOT DETECTED

## 2017-06-13 LAB — CBC WITH DIFFERENTIAL/PLATELET
Basophils Absolute: 0.1 10*3/uL (ref 0–0.1)
Basophils Relative: 1 %
Eosinophils Absolute: 0.2 10*3/uL (ref 0–0.7)
Eosinophils Relative: 2 %
HCT: 42.6 % (ref 40.0–52.0)
HEMOGLOBIN: 14.3 g/dL (ref 13.0–18.0)
LYMPHS ABS: 2 10*3/uL (ref 1.0–3.6)
LYMPHS PCT: 28 %
MCH: 29.5 pg (ref 26.0–34.0)
MCHC: 33.7 g/dL (ref 32.0–36.0)
MCV: 87.7 fL (ref 80.0–100.0)
Monocytes Absolute: 0.7 10*3/uL (ref 0.2–1.0)
Monocytes Relative: 10 %
NEUTROS ABS: 4.4 10*3/uL (ref 1.4–6.5)
NEUTROS PCT: 59 %
Platelets: 233 10*3/uL (ref 150–440)
RBC: 4.86 MIL/uL (ref 4.40–5.90)
RDW: 15 % — ABNORMAL HIGH (ref 11.5–14.5)
WBC: 7.4 10*3/uL (ref 3.8–10.6)

## 2017-06-13 LAB — BRAIN NATRIURETIC PEPTIDE: B Natriuretic Peptide: 16 pg/mL (ref 0.0–100.0)

## 2017-06-13 LAB — ETHANOL: Alcohol, Ethyl (B): <10 mg/dL (ref <10)

## 2017-06-13 LAB — TROPONIN I: Troponin I: <0.03 ng/mL (ref <0.03)

## 2017-06-13 MED ORDER — CEPHALEXIN 500 MG PO CAPS: 500.0000 mg | ORAL_CAPSULE | Freq: Three times a day (TID) | ORAL | 0 refills | Status: DC

## 2017-06-13 MED ORDER — SODIUM CHLORIDE 0.9 % IV SOLN
1.0000 g | INTRAVENOUS | Status: AC
  Administered 2017-06-13: 1 g via INTRAVENOUS
  Filled 2017-06-13: qty 10

## 2017-06-13 MED ORDER — THIAMINE HCL 100 MG/ML IJ SOLN
100.0000 mg | Freq: Once | INTRAMUSCULAR | Status: AC
  Administered 2017-06-13: 100 mg via INTRAVENOUS
  Filled 2017-06-13: qty 2

## 2017-06-13 NOTE — ED Triage Notes (Signed)
Patient to ED via EMS from home.  Per EMS daughter reports pain with increased shortness of breath with exertion and increased confusion from normal.

## 2017-06-13 NOTE — Discharge Instructions (Signed)
You have been seen in the Emergency Department (ED) today for a variety of symptoms.  Your workup today suggests that you have a urinary tract infection (UTI). ° °Please take your antibiotic as prescribed and over-the-counter pain medication (Tylenol or Motrin) as needed, but no more than recommended on the label instructions.  Drink PLENTY of fluids. ° °Call your regular doctor to schedule the next available appointment to follow up on today?s ED visit, or return immediately to the ED if your pain worsens, you have decreased urine production, develop fever, persistent vomiting, or other symptoms that concern you. °

## 2017-06-13 NOTE — ED Provider Notes (Signed)
[facility] Emergency Department Provider Note  ____________________________________________   First MD Initiated Contact with Patient 06/13/17 [phone]     (approximate)  I have reviewed the triage vital signs and the nursing notes.   HISTORY  Chief Complaint Shortness of Breath   Level 5 caveat:  history/ROS may be limited by possible chronic dementia and a history of chronic alcoholism and he does have a legal guardian, his daughter, who is at bedside and providing additional history   HPI [name] [name] is a 72 y.o. 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  Presents tonight by EMS from home for evaluation of some shortness of breath with exertion as well as increased confusion from baseline.  The patient reports that he was sitting on the edge of the bed and holding his head but denies any particular symptoms, and then he says when his daughter checked on her she was worried about him and said he had to go to the doctor.  She reports that he was short of breath and wanted to make sure he was not becoming ill like he was 2 months ago which required an extensive hospitalization.  She states that for the last 2 months he has been alcohol free and that she is certain he has not been drinking and that time.   He denies chest pain, admits to some shortness of breath with exertion, denies abdominal pain, denies nausea/vomiting, and denies dysuria.  No recent nasal congestion, runny nose, fever/chills, headache, or neck pain.  Overall the daughter describes that the symptoms were moderate but she was concerned because of his history.  Nothing in particular made them better or worse.  The patient is happy, interactive, and in no acute distress at this time.  Past Medical History:  Diagnosis Date  . COPD  (chronic obstructive pulmonary disease) (HCC)   . Male erectile disorder     Patient Active Problem List   Diagnosis Date Noted  . Sepsis (HCC) 02/08/2017  . UTI (urinary tract infection) 02/08/2017  . Alcoholism (HCC) 02/08/2017    Past Surgical History:  Procedure Laterality Date  . KNEE SURGERY Right     Prior to Admission medications   Medication Sig Start Date End Date Taking? Authorizing Provider  albuterol (PROVENTIL HFA;VENTOLIN HFA) 108 (90 Base) MCG/ACT inhaler Inhale 2 puffs into the lungs every 6 (six) hours as needed for wheezing or shortness of breath. 05/22/16   Nita SickleVeronese, Benson, MD  ALPRAZolam Prudy Feeler(XANAX) 0.5 MG tablet Take 1 tablet (0.5 mg total) by mouth 2 (two) times daily as needed for anxiety (Increased agitation). 04/05/17 04/05/18  Cuthriell, Delorise RoyalsJonathan D, PA-C  cephALEXin (KEFLEX) 500 MG capsule Take 1 capsule (500 mg total) by mouth 3 (three) times daily. 06/13/17   Loleta RoseForbach, Eren Puebla, MD  cloNIDine (CATAPRES) 0.1 MG tablet Take 1 tablet (0.1 mg total) by mouth 2 (two) times daily. 02/11/17   Houston SirenSainani, Vivek J, MD  docusate sodium (COLACE) 100 MG capsule Take 1 capsule (100 mg total) by mouth 2 (two) times daily as needed for mild constipation. 02/11/17   [place] SirenSainani, Vivek J, MD  folic acid (FOLVITE) 1 MG tablet Take 1 tablet (1 mg total) by mouth daily. 02/12/17   [place] SirenSainani, Vivek J, MD  Multiple Vitamin (MULTIVITAMIN WITH MINERALS) TABS tablet Take 1 tablet by mouth daily. 02/12/17   [place] SirenSainani, Vivek J, MD  oxyCODONE-acetaminophen (PERCOCET/ROXICET) 5-325 MG tablet Take 1 tablet by mouth every 6 (six) hours as needed for moderate pain. 02/11/17   [place] Siren, MD  sulfamethoxazole-trimethoprim (BACTRIM DS,SEPTRA DS) 800-160 MG tablet Take 1 tablet by mouth 2 (two) times daily. 04/05/17   Cuthriell, Delorise Royals, PA-C    Allergies Patient has no known allergies.  Family History  Problem Relation Age of Onset  . Hypertension Mother     Social History Social History    Tobacco Use  . Smoking status: Current Some Day Smoker    Packs/day: 0.25  . Smokeless tobacco: Never Used  Substance Use Topics  . Alcohol use: Yes    Alcohol/week: 16.8 oz    Types: 28 Cans of beer per week  . Drug use: No    Review of Systems Level 5 caveat:  history/ROS may be limited by possible chronic dementia and a history of chronic alcoholism and he does have a legal guardian, his daughter, who is at bedside and providing additional history  Constitutional: No fever/chills Eyes: No visual changes. ENT: No sore throat. Cardiovascular: Denies chest pain. Respiratory: mild to moderate SOB with exertion Gastrointestinal: No abdominal pain.  No nausea, no vomiting.  No diarrhea.  No constipation. Genitourinary: Negative for dysuria. Musculoskeletal: Negative for neck pain.  Negative for back pain. Integumentary: Negative for rash. Neurological: Negative for headaches, focal weakness or numbness. Slight increased confusion from baseline   ____________________________________________   PHYSICAL EXAM:  VITAL SIGNS: ED Triage Vitals  Enc Vitals Group     BP 06/13/17 0053 123/84     Pulse Rate 06/13/17 0053 73     Resp 06/13/17 0053 17     Temp 06/13/17 0053 97.8 F (36.6 C)     Temp Source 06/13/17 0053 Oral     SpO2 06/13/17 0053 94 %     Weight 06/13/17 0054 59 kg (130 lb)     Height 06/13/17 0054 1.702 m (5\' 7" )     Head Circumference --      Peak Flow --      Pain Score --      Pain Loc --      Pain Edu? --      Excl. in GC? --     Constitutional: Alert and oriented. Well appearing and in no acute distress. Eyes: Conjunctivae are normal.  Head: Atraumatic. Nose: No congestion/rhinnorhea. Mouth/Throat: Mucous membranes are moist. Neck: No stridor.  No meningeal signs.   Cardiovascular: Normal rate, regular rhythm. Good peripheral circulation. Grossly normal heart sounds. Respiratory: Normal respiratory effort.  No retractions. Lungs  CTAB. Gastrointestinal: Soft and nontender. No distention.  Musculoskeletal: No lower extremity tenderness nor edema. No gross deformities of extremities. Neurologic:  Baseline speech and language per daughter (a little difficult to understand, but no dysarthria nor aphasia). No gross focal neurologic deficits are appreciated.  Skin:  Skin is warm, dry and intact. No rash noted. Psychiatric: Mood and affect are normal. Speech and behavior are normal.  ____________________________________________   LABS (all labs ordered are listed, but only abnormal results are displayed)  Labs Reviewed  CBC WITH DIFFERENTIAL/PLATELET - Abnormal; Notable for the following components:      Result Value   RDW 15.0 (*)    All other components within normal limits  COMPREHENSIVE METABOLIC PANEL - Abnormal; Notable for the following components:   Glucose, Bld 142 (*)    ALT 11 (*)    All other components within normal limits  URINALYSIS, ROUTINE W  REFLEX MICROSCOPIC - Abnormal; Notable for the following components:   Color, Urine AMBER (*)    APPearance CLOUDY (*)    Hgb urine dipstick SMALL (*)    Protein, ur 30 (*)    Nitrite POSITIVE (*)    Leukocytes, UA MODERATE (*)    Bacteria, UA MANY (*)    Squamous Epithelial / LPF 0-5 (*)    All other components within normal limits  URINE CULTURE  ETHANOL  TROPONIN I  MAGNESIUM  BRAIN NATRIURETIC PEPTIDE  URINE DRUG SCREEN, QUALITATIVE ([facility] ONLY)   ____________________________________________  EKG  ED ECG REPORT I, Loleta Rose, the attending physician, personally viewed and interpreted this ECG.  Date: 06/13/2017 EKG Time: 00: 52 Rate: 80 Rhythm: normal sinus rhythm QRS Axis: normal Intervals: normal ST/T Wave abnormalities: Non-specific ST segment / T-wave changes, but no evidence of acute ischemia. Narrative Interpretation: no evidence of acute ischemia   ____________________________________________  RADIOLOGY I, Loleta Rose,  personally viewed and evaluated these images (plain radiographs) as part of my medical decision making, as well as reviewing the written report by the radiologist.  ED MD interpretation: No evidence of acute infection  Official radiology report(s): Dg Chest Portable 1 View  Result Date: 06/13/2017 CLINICAL DATA:  72 year old male with shortness of breath. 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 No focal consolidation, pleural effusion, or pneumothorax. The cardiac silhouette is within normal limits. Atherosclerotic calcification of the aortic arch. No acute osseous pathology. IMPRESSION: No active disease. Electronically Signed   By: Elgie Collard M.D.   On: 06/13/2017 01:29    ____________________________________________   PROCEDURES  Critical Care performed: No   Procedure(s) performed:   Procedures   ____________________________________________   INITIAL IMPRESSION / ASSESSMENT AND PLAN / ED COURSE  As part of my medical decision making, I reviewed the following data within the electronic MEDICAL RECORD NUMBER History obtained from family, Nursing notes reviewed and incorporated, Labs reviewed , EKG interpreted , Old chart reviewed, Radiograph reviewed  and Notes from prior ED visits    Differential diagnosis includes, but is not limited to, alcoholic ketoacidosis, viral infection Wernicke-Korsakoff syndrome, urinary tract infection, pneumonia, nonspecific metabolic abnormality, ACS, CVA.  However the patient is very well-appearing.  I reviewed his chart extensively and he appear to be quite ill if not toxic when he came in 2 months ago.  He had nystagmus, was highly confused, and had a big workup for a Korsakoff syndrome.  A low suspicion that we will find an acute or emergent medical condition tonight.  At this point based on my evaluation I think that there is no  indication for a CT scan of the head.  He is neurologically intact and has no focal deficits.  I think it is worthwhile to check extensive lab work to make sure there is no evidence of any acute abnormality and we will also check a urinalysis and chest x-ray.  My expectation is that we will not find anything to keep him in the hospital and I expressed this to the daughter who is comfortable with the plan as long as he has a reassuring workup.  The patient is very comfortable at this time and was joking with me and interacting appropriately, even offered that he should come along with me and help me out seeing patients since he is here anyway.  No indication for emergent intervention at this time.  Clinical  Course as of Jun 13 610  Mon Jun 13, 2017  0255 Alcohol, Ethyl (B): <10 [CF]  0255 Anion gap: 10 [CF]  0255 Creatinine: 0.71 [CF]  0255 B Natriuretic Peptide: 16.0 [CF]  0255 Troponin I: <0.03 [CF]    Clinical Course User Index [CF] Loleta Rose, MD    ____________________________________________  FINAL CLINICAL IMPRESSION(S) / ED DIAGNOSES  Final diagnoses:  Urinary tract infection without hematuria, site unspecified     MEDICATIONS GIVEN DURING THIS VISIT:  Medications  thiamine (B-1) injection 100 mg (100 mg Intravenous Given 06/13/17 0231)  cefTRIAXone (ROCEPHIN) 1 g in sodium chloride 0.9 % 100 mL IVPB (0 g Intravenous Stopped 06/13/17 0425)     ED Discharge Orders        Ordered    cephALEXin (KEFLEX) 500 MG capsule  3 times daily     06/13/17 0447       Note:  This document was prepared using Dragon voice recognition software and may include unintentional dictation errors.    Loleta Rose, MD 06/13/17 [phone]

## 2017-06-13 NOTE — ED Notes (Signed)
Daughter reports that patient was complaining of right rib pain and appeared to be more short of breath than usual and that was why she would like to have him checked out.  At this time patient denies any type of pain.

## 2017-06-15 LAB — URINE CULTURE
Culture: >=100000 — AB
SPECIAL REQUESTS: NORMAL

## 2020-04-09 ENCOUNTER — Encounter: Payer: Self-pay | Admitting: Emergency Medicine

## 2020-04-09 ENCOUNTER — Emergency Department
Admission: EM | Admit: 2020-04-09 | Discharge: 2020-04-09 | Disposition: A | Discharge status: Home / Self Care | Payer: Medicare Other | Attending: Emergency Medicine | Admitting: Emergency Medicine

## 2020-04-09 ENCOUNTER — Emergency Department: Payer: Medicare Other

## 2020-04-09 ENCOUNTER — Other Ambulatory Visit: Payer: Self-pay

## 2020-04-09 DIAGNOSIS — J449 Chronic obstructive pulmonary disease, unspecified: Secondary | ICD-10-CM | POA: Diagnosis not present

## 2020-04-09 DIAGNOSIS — Z7951 Long term (current) use of inhaled steroids: Secondary | ICD-10-CM | POA: Insufficient documentation

## 2020-04-09 DIAGNOSIS — S0081XA Abrasion of other part of head, initial encounter: Secondary | ICD-10-CM | POA: Insufficient documentation

## 2020-04-09 DIAGNOSIS — W19XXXA Unspecified fall, initial encounter: Secondary | ICD-10-CM | POA: Insufficient documentation

## 2020-04-09 DIAGNOSIS — S0990XA Unspecified injury of head, initial encounter: Secondary | ICD-10-CM | POA: Diagnosis present

## 2020-04-09 DIAGNOSIS — M25552 Pain in left hip: Secondary | ICD-10-CM | POA: Insufficient documentation

## 2020-04-09 DIAGNOSIS — Z87891 Personal history of nicotine dependence: Secondary | ICD-10-CM | POA: Insufficient documentation

## 2020-04-09 DIAGNOSIS — M545 Low back pain, unspecified: Secondary | ICD-10-CM | POA: Diagnosis not present

## 2020-04-09 NOTE — ED Provider Notes (Signed)
[facility] Emergency Department Provider Note ____________________________________________   Event Date/Time   First MD Initiated Contact with Patient 04/09/20 1215     (approximate)  I have reviewed the triage vital signs and the nursing notes.   HISTORY  Chief Complaint Hip Pain  Level 5 caveat: History present illness limited due to dementia  HPI [name] [name] is a 74 y.o. male with PMH as noted below who presents with left hip pain as well as a head injury.  Per the daughter, the patient fell on 12/17.  At that time she noticed bruising on both sides of his head.  The patient did not get checked out at that time.  Since then he has been complaining of some pain in the left hip and lower back.  However, when asked if he is in any pain right now, the patient states he feels fine.  Past Medical History:  Diagnosis Date  . COPD (chronic obstructive pulmonary disease) (HCC)   . Male erectile disorder     Patient Active Problem List   Diagnosis Date Noted  . Sepsis (HCC) 02/08/2017  . UTI (urinary tract infection) 02/08/2017  . Alcoholism (HCC) 02/08/2017    Past Surgical History:  Procedure Laterality Date  . KNEE SURGERY Right     Prior to Admission medications   Medication Sig Start Date End Date Taking? Authorizing Provider  acetaminophen (TYLENOL) 325 MG tablet Take 650 mg by mouth every 4 (four) hours as needed for mild pain or moderate pain.   Yes [provider]  albuterol (PROVENTIL HFA;VENTOLIN HFA) 108 (90 Base) MCG/ACT inhaler Inhale 2 puffs into the lungs every 6 (six) hours as needed for wheezing or shortness of breath. Patient taking differently: Inhale 1 puff into the lungs every 6 (six) hours as needed for wheezing or shortness of breath. 05/22/16  Yes Don Perking, [place], MD  bisacodyl (DULCOLAX) 5 MG EC tablet Take 10 mg by mouth daily as needed for moderate constipation or mild constipation.   Yes [provider]   budesonide-formoterol (SYMBICORT) 160-4.5 MCG/ACT inhaler Inhale 1 puff into the lungs 2 (two) times daily.   Yes [provider]  docusate sodium (COLACE) 100 MG capsule Take 1 capsule (100 mg total) by mouth 2 (two) times daily as needed for mild constipation. 02/11/17  Yes Sainani, Rolly Pancake, MD  donepezil (ARICEPT) 10 MG tablet Take 10 mg by mouth daily.   Yes [provider]  folic acid (FOLVITE) 1 MG tablet Take 1 tablet (1 mg total) by mouth daily. 02/12/17  Yes Sainani, Rolly Pancake, MD  ipratropium-albuterol (DUONEB) 0.5-2.5 (3) MG/3ML SOLN Take 3 mLs by nebulization every 6 (six) hours as needed (COPD symptoms).   Yes [provider]  Multiple Vitamin (MULTIVITAMIN WITH MINERALS) TABS tablet Take 1 tablet by mouth daily. 02/12/17  Yes Sainani, Rolly Pancake, MD  Multiple Vitamins-Minerals (MULTIVITAMIN WITH MINERALS) tablet Take 1 tablet by mouth daily.   Yes [provider]  senna (SENOKOT) 8.6 MG TABS tablet Take 1 tablet by mouth daily.   Yes [provider]  sertraline (ZOLOFT) 50 MG tablet Take 50 mg by mouth daily.   Yes [provider]  tamsulosin (FLOMAX) 0.4 MG CAPS capsule Take 0.4 mg by mouth daily.   Yes [provider]  tolnaftate (TINACTIN) 1 % cream Apply 1 application topically 2 (two) times daily. (apply to left front groin and flank)   Yes [provider]  Vitamin D, Ergocalciferol, (DRISDOL) 1.25  MG (50000 UNIT) CAPS capsule Take 50,000 Units by mouth every 7 (seven) days.   Yes [provider]    Allergies Patient has no known allergies.  Family History  Problem Relation Age of Onset  . Hypertension Mother     Social History Social History   Tobacco Use  . Smoking status: Former Games developer  . Smokeless tobacco: Never Used  Substance Use Topics  . Alcohol use: Not Currently    Alcohol/week: 28.0 standard drinks    Types: 28 Cans of beer per week  . Drug use: No    Review of Systems Level  5 caveat: Unable to obtain review of systems due to dementia   ____________________________________________   PHYSICAL EXAM:  VITAL SIGNS: ED Triage Vitals  Enc Vitals Group     BP 04/09/20 1203 100/74     Pulse Rate 04/09/20 1203 73     Resp --      Temp 04/09/20 1203 98.5 F (36.9 C)     Temp Source 04/09/20 1203 Oral     SpO2 04/09/20 1203 93 %     Weight 04/09/20 1205 128 lb (58.1 kg)     Height 04/09/20 1205 5\' 6"  (1.676 m)     Head Circumference --      Peak Flow --      Pain Score --      Pain Loc --      Pain Edu? --      Excl. in GC? --     Constitutional: Alert, oriented x1.  Relatively well appearing and in no acute distress. Eyes: Conjunctivae are normal.  EOMI.  PERRLA. Head: Healing superficial abrasion to the left forehead. Nose: No congestion/rhinnorhea. Mouth/Throat: Mucous membranes are moist.   Neck: Normal range of motion.  No midline cervical spinal tenderness. Cardiovascular: Normal rate, regular rhythm.  Good peripheral circulation. Respiratory: Normal respiratory effort.  No retractions.  Gastrointestinal: Soft and nontender. No distention.  Genitourinary: No flank tenderness. Musculoskeletal: No lower extremity edema.  Extremities warm and well perfused.  Mild lumbar midline tenderness with no step-off or crepitus.  Mild pain on range of motion of left hip. Neurologic:  Normal speech and language.  No facial droop.  Normal coordination.  Motor intact in all extremities. Skin:  Skin is warm and dry. No rash noted. Psychiatric: Calm and cooperative.  ____________________________________________   LABS (all labs ordered are listed, but only abnormal results are displayed)  Labs Reviewed - No data to display ____________________________________________  EKG   ____________________________________________  RADIOLOGY  CT head: No ICH or acute stroke XR L hip: Interpreted by me shows no acute fracture. XR lumbar spine: No acute  fracture  ____________________________________________   PROCEDURES  Procedure(s) performed: No  Procedures  Critical Care performed: No ____________________________________________   INITIAL IMPRESSION / ASSESSMENT AND PLAN / ED COURSE  Pertinent labs & imaging results that were available during my care of the patient were reviewed by me and considered in my medical decision making (see chart for details).  74 year old male with PMH as noted above including history of dementia presents with his daughter for evaluation after an apparent fall last week.  The patient had some bruising on his head at the time.  Since then he has reported left hip pain although it is not an active pain when sitting in the stretcher.  On exam he is well-appearing.  His vital signs are normal.  Physical exam is remarkable for mild tenderness to the lumbar spine and  mild pain on range of motion of his left hip.  Although it is several days since the fall, the daughter is concerned about head injury.  We will obtain a CT as well as x-rays of the lumbar spine and left hip.  There is no indication for lab work-up at this time.  ----------------------------------------- 2:27 PM on 04/09/2020 -----------------------------------------  Imaging is negative for acute traumatic findings.  There is an exostosis on the opposite side of the pelvis from the pain.  Per radiology, this will need outpatient MRI follow-up.  I counseled the daughter on the results of the work-up and have also provided written follow-up plan and discharge instructions.  Return precautions given, and the daughter expressed understanding.  The patient is stable for discharge at this time.  ____________________________________________   FINAL CLINICAL IMPRESSION(S) / ED DIAGNOSES  Final diagnoses:  Left hip pain      NEW MEDICATIONS STARTED DURING THIS VISIT:  New Prescriptions   No medications on file     Note:  This document  was prepared using Dragon voice recognition software and may include unintentional dictation errors.   Dionne Bucy, MD 04/09/20 1428

## 2020-04-09 NOTE — ED Triage Notes (Signed)
Pt ems from [facility] for left hip pain. Pt s/p fall 12/17. Per ems staff has noticed pt favoring left left last few days. Per ems family has noticed facial bruising and scrapes and wants pts head looked at. Pt is in memory care unit

## 2020-04-09 NOTE — Discharge Instructions (Addendum)
Mr. Brue had a CT of the head and X-rays of the left hip, pelvis and lumbar spine.  There is no fracture or other trauma.  The X-ray of the pelvis shows an exostosis (bony growth) on the right side of the pelvis.  This should be followed with an outpatient MRI that can be ordered by his regular doctor.  Mr. Mcfarland should return to the ER for any new or worsening pain, inability to walk, change in mental status, or any other new or worsening symptoms that are concerning

## 2020-06-20 ENCOUNTER — Encounter: Payer: Self-pay | Admitting: Nurse Practitioner

## 2020-06-20 ENCOUNTER — Other Ambulatory Visit: Payer: Self-pay

## 2020-06-20 ENCOUNTER — Non-Acute Institutional Stay: Payer: Medicare Other | Admitting: Nurse Practitioner

## 2020-06-20 VITALS — BP 140/60 | HR 76 | Temp 97.8°F | Wt 112.3 lb

## 2020-06-20 DIAGNOSIS — F1027 Alcohol dependence with alcohol-induced persisting dementia: Secondary | ICD-10-CM

## 2020-06-20 DIAGNOSIS — Z515 Encounter for palliative care: Secondary | ICD-10-CM

## 2020-06-20 NOTE — Progress Notes (Signed)
Therapist, nutritional Palliative Care Consult Note Telephone: [phone]  Fax: [phone]  PATIENT NAME: [name] [name] DOB: [birth year] MRN: [id number]  PRIMARY CARE PROVIDER: Dr [facility] RESPONSIBLE PARTY:   Gypsy Decant daughter [phone]  I was asked by Dr Nelson Chimes to see [name] [name] for Palliative care consult for complex medical decision making  1. Advance Care Planning; Full code, call pending to family for further discussion of complex medical decision making  2. Goals of Care: Goals include to maximize quality of life and symptom management. Our advance care planning conversation included a discussion about:     The value and importance of advance care planning   Exploration of personal, cultural or spiritual beliefs that might influence medical decisions   Exploration of goals of care in the event of a sudden injury or illness   Identification and preparation of a healthcare agent   Review and updating or creation of an advance directive document.  3. Palliative care encounter; Palliative care encounter; Palliative medicine team will continue to support patient, patient's family, and medical team. Visit consisted of counseling and education dealing with the complex and emotionally intense issues of symptom management and palliative care in the setting of serious and potentially life-threatening illness  4. f/u 1 week for ongoing monitoring chronic disease progression, ongoing discussions complex medical decision making  I spent 55 minutes providing this consultation,  Starting at 1:20pm. More than 50% of the time in this consultation was spent coordinating communication.   HISTORY OF PRESENT ILLNESS:  Auron Tadros is a 75 y.o. year old male with multiple medical problems including  Dementia, hypertension, BPH, COPD, vitamin D deficiency, mood disorder, anxiety, history of covid. [name] [name] [facility]. [name] [name] is an extensive assistance for bed mobility, bathing, dressing, toileting. He does feed himself after tray setup currently on a regular diet regular texture regular liquid consistency with Med Plus supplement. Current weight is 12.3 lb with 2.2 lb weight loss and BMI 17.1. Last primary provider [facility] note 1/19 / 2022 for depression recurrent currently on medical treatment. Medical goals are for full code, aggressive interventions. [name] [name] is currently being treated for covid. 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 Recurrent major depression mild with history of mood disorder prescribe Zoloft. No changes at this visit. Staff endorses [name] [name] has been moved to the covid unit as he is covid positive currently. Appetite has declined. [name] is weak per staff. At present [name] [name] is lying in bed. [name] [name] appears comfortable, debilitated. No visitors present. I visited and observed [name] [name]. We talked about purpose of palliative care visit. [name] [name] did make eye contact with verbal cues. [name] [name] was able to answer simple questions such as denying he was having any symptoms of pain or shortness of breath. [name] [name] endorses he is not hungry. [name] [name] endorses he just wants to rest. [name] [name] was cooperative with assessment. Emotional support provided. Medical goals reviewed. I have attempted to contact [name] [name] daughter for discussion of Palliative care, complex medical decision-making, code status. I updated nursing staff.  2 / 21 / 2022 WBC 8.2, hemoglobin 16.8, hematocrit 49.8, platelets 250, sodium 147, potassium 4.3, chloride 102, CO2 26, calcium 9.8, bun 20.9, creatinine 0.97, glucose 104, total protein  7.3, albumin 3.8.  2 / 28 / 2022 sodium 147, potassium 4.1, chloride 105, CO2  29, calcium 10, bun 23.3, creatinine 2.0, glucose 95.   Palliative Care was asked to help address goals of care.   CODE STATUS: Full code  PPS: 30% HOSPICE ELIGIBILITY/DIAGNOSIS: TBD  PAST MEDICAL HISTORY:  Past Medical History:  Diagnosis Date  . COPD (chronic obstructive pulmonary disease) (HCC)   . Male erectile disorder     SOCIAL HX:  Social History   Tobacco Use  . Smoking status: Former Games developer  . Smokeless tobacco: Never Used  Substance Use Topics  . Alcohol use: Not Currently    Alcohol/week: 28.0 standard drinks    Types: 28 Cans of beer per week    ALLERGIES: No Known Allergies   PHYSICAL EXAM:   General: NAD, frail appearing, thin, confused male Cardiovascular: regular rate and rhythm Pulmonary: clear ant fields Neurological: generalized weakness  Tajia Szeliga [name] [place], NP

## 2020-07-07 ENCOUNTER — Non-Acute Institutional Stay: Payer: Medicare Other | Admitting: Nurse Practitioner

## 2020-07-07 ENCOUNTER — Encounter: Payer: Self-pay | Admitting: Nurse Practitioner

## 2020-07-07 ENCOUNTER — Other Ambulatory Visit: Payer: Self-pay

## 2020-07-07 VITALS — BP 140/60 | HR 76 | Temp 98.0°F | Resp 18 | Wt 112.3 lb

## 2020-07-07 DIAGNOSIS — F1027 Alcohol dependence with alcohol-induced persisting dementia: Secondary | ICD-10-CM

## 2020-07-07 DIAGNOSIS — Z515 Encounter for palliative care: Secondary | ICD-10-CM

## 2020-07-07 NOTE — Progress Notes (Signed)
[place] Consult Note Telephone: [phone]  Fax: [phone]    Date of encounter: 07/07/20 PATIENT NAME: [name] [name] [id number] [phone] (home)  DOB: 1945/05/05 MRN: [id number]   PRIMARY CARE PROVIDER: Dr [facility] RESPONSIBLE PARTY:   Remonia Richter daughter [phone]  I met face to face with patient facility. Palliative Care was asked to follow this patient by consultation to help address advance care planning and goals of care, complex medical decision, chronic disease management. This is a follow up visit.  1.Advance Care Planning;Full code, call pending to family for further discussion of complex medical decision making  2. Goals of Care: Goals include to maximize quality of life and symptom management. Our advance care planning conversation included a discussion about:   The value and importance of advance care planning  Exploration of personal, cultural or spiritual beliefs that might influence medical decisions  Exploration of goals of care in the event of a sudden injury or illness  Identification and preparation of a healthcare agent  Review and updating or creation of anadvance directive document.  3.Palliative care encounter; Palliative care encounter; Palliative medicine team will continue to support patient, patient's family, and medical team. Visit consisted of counseling and education dealing with the complex and emotionally intense issues of symptom management and palliative care in the setting of serious and potentially life-threatening illness  4. f/u1 month for ongoing monitoring chronic disease progression, ongoing discussions complex medical decision making  I spent 35 minutes providing this consultation,  Starting at 2:00pm.  More than 50% of the time in this consultation was spent in counseling and care coordination.  CODE STATUS:  Full code  PPS: 30%  HOSPICE ELIGIBILITY/DIAGNOSIS: TBD  CHIEF COMPLAINT: Follow-up visit for chronic disease management, complex medical decision making  HISTORY OF PRESENT ILLNESS:  [name] [name] is a 75 y.o. year old male  with Dementia, hypertension, BPH, COPD, vitamin D deficiency, mood disorder, anxiety, history of covid. [name] [name] resides at [place] at [facility]. [name] [name] is an extensive assistance for bed mobility, bathing, dressing, toileting.[name] [name] does require assistance from the staff for feeding.  Current weight 112.3 lbs with 7.7 lb weight loss over 5 months with bmi 17.1 currently on a regular diet, regular texture, regular liquids consistency with med plus supplement. That's endorses no new changes or concerns. No recent wound, hospitalizations, falls. [name] [name] does remain a full code. Attempted last palliative care visit to contact family though no return call. Follow up palliative care visit today for ongoing complex medical decision-making, dress code status, chronic disease progression with overall decline. At present [name] [name] is lying in bed. [name] [name] appears comfortable so debilitated and oriented to self. No visitors present. I visited an observed [name] [name]. We talked about purpose of palliative care visit the limited with cognitive impairment. [name] did make eye contact with verbal cues and answer simple questions. [name] [name] denied symptoms of pain or shortness of breath. We talked about his appetite. [name] [name] endorses he was not hungry. [name] [name] still had his tray in front of him and we talked about the food that he ate. [name] [name] most cooperative with assessment. [name] [name] does not appear in any distress. Medical goals reviewed. Will continue to try to contact family for further discussion complex medical decision-making, code status and will continue to Monitor and follow with chronic disease  progression  overall decline with dementia. I updated staff no new changes at current time.  06/25/2020 Sodium 142, potassium 4.1, chloride 99, Co2 28, calcium 9.3, bun 8.8, creatinine 0.52, glucose 86, total protein 6.2, albumin 2.9, alt 8, ast 22.  CURRENT PROBLEM LIST:  Patient Active Problem List   Diagnosis Date Noted  . Sepsis ([place]) 02/08/2017  . UTI (urinary tract infection) 02/08/2017  . Alcoholism ([place]) 02/08/2017   PAST MEDICAL HISTORY:  Active Ambulatory Problems    Diagnosis Date Noted  . Sepsis ([place]) 02/08/2017  . UTI (urinary tract infection) 02/08/2017  . Alcoholism ([place]) 02/08/2017   Resolved Ambulatory Problems    Diagnosis Date Noted  . No Resolved Ambulatory Problems   Past Medical History:  Diagnosis Date  . COPD (chronic obstructive pulmonary disease) ([place])   . Male erectile disorder    SOCIAL HX:  Social History   Tobacco Use  . Smoking status: Former Research scientist (life sciences)  . Smokeless tobacco: Never Used  Substance Use Topics  . Alcohol use: Not Currently    Alcohol/week: 28.0 standard drinks    Types: 28 Cans of beer per week   FAMILY HX:  Family History  Problem Relation Age of Onset  . Hypertension Mother     ALLERGIES: No Known Allergies    ROS; reviewed with staff systems all negative except anorexia; generalized weakness, few wheezes   Physical Exam: Constitutional: Vital signs see above, NAD General: frail appearing, thin/WNWD, debilitated male EYES:  lids intact, no discharge  ENMT: oral mucous membranes moist, dentition poor CV: S1S2, RRR, no LE edema Pulmonary: LCTA, no increased work of breathing, no cough, few wheezes, room air Abdomen: intake 25-50%, normo-active BS +  4 quadrants, soft and non tender GU: deferred MSK:  decreased ROM in all extremities, non ambulatory Skin: warm and dry, no rashes or wounds on visible skin Neuro: Generalized weakness, moderate cognitive impairment Psych: non-anxious affect, A and O to  self Hem/lymph/immuno: no widespread bruising  I reviewed the patient chart, labs, medications and all admission documents from the facility here. I reviewed the patient, all hospital/facility records including H&P, discharge summary, medications, Lab tests ect. I discussed case with staff and discussed diagnosis and treatment plans with staff.   *This note was dictated using voice recognition software.  Despite best efforts to proofread, errors can occur which can change the meaning.  Any change was purely unintentional.  Thank you for the opportunity to participate in the care of Mr. Steinert.  The palliative care team will continue to follow. Please call our office at [phone] if we can be of additional assistance.  Christin Ihor Gully, NP , DNP, MPH, AGPCNP-BC, ACHPN

## 2020-07-31 ENCOUNTER — Other Ambulatory Visit: Payer: Self-pay

## 2020-07-31 ENCOUNTER — Non-Acute Institutional Stay: Payer: Medicare Other | Admitting: Nurse Practitioner

## 2020-07-31 ENCOUNTER — Encounter: Payer: Self-pay | Admitting: Nurse Practitioner

## 2020-07-31 VITALS — BP 140/60 | HR 76 | Temp 97.6°F | Resp 18 | Wt 112.3 lb

## 2020-07-31 DIAGNOSIS — R54 Age-related physical debility: Secondary | ICD-10-CM

## 2020-07-31 DIAGNOSIS — R630 Anorexia: Secondary | ICD-10-CM

## 2020-07-31 DIAGNOSIS — Z515 Encounter for palliative care: Secondary | ICD-10-CM

## 2020-07-31 DIAGNOSIS — F1027 Alcohol dependence with alcohol-induced persisting dementia: Secondary | ICD-10-CM

## 2020-07-31 NOTE — Progress Notes (Addendum)
[facility] Palliative Care Consult Note Telephone: [phone]  Fax: [phone]    Date of encounter: 07/31/20 PATIENT NAME: [name] [name] [place] [address]   [phone] (home)  DOB: 04/20/1945 MRN: [id number] PRIMARY CARE PROVIDER:   Dr [facility] Perrin Maltese, MD,  [place] [address] [phone]   RESPONSIBLE PARTY:    Contact Information    Name Relation Home Work Mobile   [name],Gwendolyn Daughter [phone]  [phone]   Moiz, Ryant [phone]  [phone]     I met face to face with patient/staff at facility. Palliative Care was asked to follow this patient by consultation request of  Dr Reesa Chew to continue to address advance care planning and complex medical decision making. This is a follow up visit  ASSESSMENT AND PLAN / RECOMMENDATIONS:   Advance Care Planning/Goals of Care: Goals include to maximize quality of life and symptom management. Our advance care planning conversation included a discussion about:     The value and importance of advance care planning   Experiences with loved ones who have been seriously ill or have died   Exploration of personal, cultural or spiritual beliefs that might influence medical decisions   Exploration of goals of care in the event of a sudden injury or illness   Identification and preparation of a healthcare agent   Review and updating or creation of an  advance directive document .  Decision not to resuscitate or to de-escalate disease focused treatments due to poor prognosis.  CODE STATUS: Full code  Symptom Management/Plan:  1.Advance Care Planning;Full code, call pending to family for further discussion of complex medical decision making  2. Goals of Care: Goals include to maximize quality of life and symptom management. Our advance care planning conversation included a discussion about:   The value  and importance of advance care planning  Exploration of personal, cultural or spiritual beliefs that might influence medical decisions  Exploration of goals of care in the event of a sudden injury or illness  Identification and preparation of a healthcare agent  Review and updating or creation of anadvance directive document.  3.Palliative care encounter; Palliative care encounter; Palliative medicine team will continue to support patient, patient's family, and medical team. Visit consisted of counseling and education dealing with the complex and emotionally intense issues of symptom management and palliative care in the setting of serious and potentially life-threatening illness  4. Anorexia; continue to encourage to eat, supplement, weights  5. Debility, progressive secondary to dementia; continue supportive measure, ongoing discussions of goc with family. Message left for [name] [name] to return call for further discussions   Follow up Palliative Care Visit: Palliative care will continue to follow for complex medical decision making, advance care planning, and clarification of goals. Return 4 weeks or prn.  I spent 35 minutes providing this consultation. More than 50% of the time in this consultation was spent in counseling and care coordination.  This visit was coded based on medical decision making (MDM). yes  PPS: 40%  HOSPICE ELIGIBILITY/DIAGNOSIS: TBD  Chief Complaint: decrease appetite  HISTORY OF PRESENT ILLNESS:  [name] [name] is a 75 y.o. year old male  with NESS:  [name] [name] is a 75 y.o. year old male  with Dementia, hypertension, BPH, COPD, vitamin D deficiency, mood disorder, anxiety, history of covid. [name] [name] resides at [place] at [facility]. [name] [name] is an extensive assistance  for bed mobility, bathing, dressing, toileting.[name] [name] does require assistance from the staff for feeding.Currently on a regular  diet, regular texture, regular liquids consistency with med plus supplement. No recent wound, hospitalizations, falls. [name] [name] does remain a full code. Follow-up [facility] visit at [facility] where Mr. Schembri resides for anorexia, monitoring weights, disease progression of dementia. Staff endorses no new changes or concerns.  At present, [name] [name] is lying in bed, appears comfortable, debilitated, thin, no visitors present. I visited and observed [name], made eye contact, interactive smiling with mumbling words with intermit clear words. [name] [name] attempted to answer questions thought not usually correct answers, pleasant and cooperative for assessment. No meaningful discussion with cognitive impairment. Emotional support provided. Continue to attempt to reach family, message left for [name] [name] for ongoing discussions of code status, goc. I updated nursing staff, no changes at this time  07/10/2020 WBC 5.4, hemoglobin 12.5, hematocrit 37.7, platelets 285, sodium 140, potassium 4, chloride 100, Co2 29, calcium 9.3, bun 8.7, creatinine 0.54, glucose 80, total protein 6.1, albumin 3.1, alt 9, ast 18  History obtained from review of EMR, discussion with primary team, and interview with  facility staff/caregiver and [name] [name].  I reviewed available labs, medications, imaging, studies and related documents from the EMR.  Records reviewed and summarized above.   ROS Full 14 system review of systems performed and negative with exception of: as per HPI.  Physical Exam: Constitutional: NAD General: frail appearing, chronically ill, thin, oriented to self male EYES:  lids intact, no discharge  ENMT: oral mucous membranes moist CV: S1S2, RRR, no LE edema Pulmonary: LCTA, no increased work of breathing, no cough, room air Abdomen: intake 50%, normo-active BS + 4 quadrants, soft and non tender, GU: deferred MSK: moves all extremities, w/c dependent Skin: warm and dry, no rashes or  wounds on visible skin Neuro:  + generalized weakness,  + cognitive impairment Psych: non-anxious affect, Alert, oriented to person  Questions and concerns were addressed. The staff was encouraged to call with questions and/or concerns.  Provided general support and encouragement, no other unmet needs identified  Thank you for the opportunity to participate in the care of Mr. Wert.  The palliative care team will continue to follow. Please call our office at [phone] if we can be of additional assistance.   This chart was dictated using voice recognition software. Despite best efforts to proofread, errors can occur which can change the documentation meaning.   Blakeley Scheier Ihor Gully, NP , MSN, [facility]

## 2020-10-10 ENCOUNTER — Non-Acute Institutional Stay: Payer: Medicare Other | Admitting: Nurse Practitioner

## 2020-10-10 ENCOUNTER — Other Ambulatory Visit: Payer: Self-pay

## 2020-10-10 ENCOUNTER — Encounter: Payer: Self-pay | Admitting: Nurse Practitioner

## 2020-10-10 DIAGNOSIS — R630 Anorexia: Secondary | ICD-10-CM

## 2020-10-10 DIAGNOSIS — F1027 Alcohol dependence with alcohol-induced persisting dementia: Secondary | ICD-10-CM

## 2020-10-10 DIAGNOSIS — R54 Age-related physical debility: Secondary | ICD-10-CM

## 2020-10-10 NOTE — Progress Notes (Signed)
[facility] Palliative Care Consult Note Telephone: [phone]  Fax: [phone]    Date of encounter: 10/10/20 PATIENT NAME: [name] [name] [place] [address]   [phone] (home)  DOB: [birth year] MRN: [id number] PRIMARY CARE PROVIDER:   Dr Lucianne Lei [facility]  RESPONSIBLE PARTY:    Contact Information     Name Relation Home Work Mobile   Adams,Gwendolyn Daughter [phone]  [phone]   Jaelynn, Currier [phone]  [phone]      I met face to face with patient facility. Palliative Care was asked to follow this patient by consultation request of  Dr Nona Dell to address advance care planning and complex medical decision making. This is a follow up visit.  ASSESSMENT AND PLAN / RECOMMENDATIONS:   Symptom Management/Plan:   1. Advance Care Planning; Full code, call pending to family for further discussion of complex medical decision making, it has been difficulty reaching family for discussions; [facility] NP to contact family to further discuss options   2. Goals of Care: Goals include to maximize quality of life and symptom management. Our advance care planning conversation included a discussion about:    The value and importance of advance care planning  Exploration of personal, cultural or spiritual beliefs that might influence medical decisions  Exploration of goals of care in the event of a sudden injury or illness  Identification and preparation of a healthcare agent  Review and updating or creation of an advance directive document.   3. Palliative care encounter; Palliative care encounter; Palliative medicine team will continue to support patient, patient's family, and medical team. Visit consisted of counseling and education dealing with the complex and emotionally intense issues of symptom management and palliative care in the setting of serious and potentially life-threatening illness   4.  Anorexia; continue to encourage to eat, supplement, weights   5. Debility, progressive secondary to dementia; continue supportive measure, ongoing discussions of goc with family. Message left for [name] to return call for further discussions   Follow up Palliative Care Visit: Palliative care will continue to follow for complex medical decision making, advance care planning, and clarification of goals. Return 4 weeks or prn.  I spent 37 minutes providing this consultation. More than 50% of the time in this consultation was spent in counseling and care coordination.  PPS: 30%  Chief Complaint: Follow up Palliative consult for complex medical decision making  HISTORY OF PRESENT ILLNESS:  [name] [name] is a 75 y.o. year old male  with multiple medical problems including Dementia, hypertension, BPH, COPD, vitamin D deficiency, mood disorder, anxiety, history of covid. [name] [name] resides at [place] at [facility]. [name] [name] is an extensive assistance for bed mobility, bathing, dressing, toileting.[name] [name] does require assistance from the staff for feeding. Currently on a regular diet, regular texture, regular liquids consistency with med plus supplement. [name] [name] current weight 112.5lbs. At present [name] [name] is lying in bed, appears weak, with muscle wasting, chronically ill, debilitated, no distress. No visitors present. I visited and observed [name] [name]. [name] did make eye contact, answer few simple questions denying pain. He denied being hungry. [name] [name] was cooperative with assessment. Emotional support provided. I have attempted to contact [name] for further discussion of goc, code status, option of hospice services as it appears with [name] [name] overall decline, progression in chronic disease of dementia he may quality for hospice services. I updated staff and  [facility] NP will continue discussions with family also.  History  obtained from review of EMR, discussion with facility staff and Mr. Kushner.  I reviewed available labs, medications, imaging, studies and related documents from the EMR.  Records reviewed and summarized above.   ROS Full 14 system review of systems performed and negative with exception of: as per HPI.   Physical Exam: Constitutional: NAD General: frail appearing, thin male chronically ill with muscle wasting atrophy, severe debility EYES: lids intact ENMT: oral mucous membranes moist, dentition poor CV: S1S2, RRR, Pulmonary: decreased throughout, no increased work of breathing,  Abdomen: normo-active BS + 4 quadrants, soft and non tender MSK: bed-bound with muscle wasting, atrophy Skin: warm and dry, no rashes or wounds on visible skin Neuro:  + generalized weakness,  +severe cognitive impairment Psych: flat affect, Alert  Questions and concerns were addressed. The facility staff was encouraged to call with questions and/or concerns.  Provided general support and encouragement, no other unmet needs identified   Thank you for the opportunity to participate in the care of [name] [name].  The palliative care team will continue to follow. Please call our office at [phone] if we can be of additional assistance.   This chart was dictated using voice recognition software.  Despite best efforts to proofread,  errors can occur which can change the documentation meaning.   Javeon Macmurray Ihor Gully, NP , MSN, [facility]

## 2020-11-24 ENCOUNTER — Encounter: Payer: Self-pay | Admitting: Nurse Practitioner

## 2020-11-24 ENCOUNTER — Other Ambulatory Visit: Payer: Self-pay

## 2020-11-24 ENCOUNTER — Non-Acute Institutional Stay: Payer: Medicare Other | Admitting: Nurse Practitioner

## 2020-11-24 VITALS — BP 140/60 | HR 76 | Temp 97.9°F | Resp 18 | Wt 112.5 lb

## 2020-11-24 DIAGNOSIS — R630 Anorexia: Secondary | ICD-10-CM

## 2020-11-24 DIAGNOSIS — F1027 Alcohol dependence with alcohol-induced persisting dementia: Secondary | ICD-10-CM

## 2020-11-24 DIAGNOSIS — Z515 Encounter for palliative care: Secondary | ICD-10-CM

## 2020-11-24 NOTE — Progress Notes (Signed)
[facility] Consult Note Telephone: [phone]  Fax: [phone]    Date of encounter: 11/24/20 PATIENT NAME: [name] [name] [place] [address]   [phone] (home)  DOB: [birth year] MRN: [id number] PRIMARY CARE PROVIDER:    Dr Lucianne Lei [facility]  RESPONSIBLE PARTY:    Contact Information     Name Relation Home Work Mobile   Adams,Gwendolyn Daughter [phone]  [phone]   Mercury, Rock [phone]  [phone]      I met face to face with patient in facility. Palliative Care was asked to follow this patient by consultation request of  Dr Nona Dell to address advance care planning and complex medical decision making. This is a follow up visit. ASSESSMENT AND PLAN / RECOMMENDATIONS:  Symptom Management/Plan: 1. Advance Care Planning; Full code, call pending to family for further discussion of complex medical decision making, difficulty reaching family   2. Palliative care encounter; Palliative care encounter; Palliative medicine team will continue to support patient, patient's family, and medical team. Visit consisted of counseling and education dealing with the complex and emotionally intense issues of symptom management and palliative care in the setting of serious and potentially life-threatening illness   3. Anorexia; continue to encourage to eat, supplement, weights; current weight 112.5 lbs with BMI 17.1   5. Dementia progressive slow decline, stable at present time; continue supportive measure, ongoing discussions of goc with family.   Follow up Palliative Care Visit: Palliative care will continue to follow for complex medical decision making, advance care planning, and clarification of goals. Return 8 weeks or prn.  I spent 40 minutes providing this consultation. More than 50% of the time in this consultation was spent in counseling and care coordination.  PPS: 30%  Chief  Complaint: Follow up palliative consult for complex medical decision making  HISTORY OF PRESENT ILLNESS:  [name] [name] is a 75 y.o. year old male  with multiple medical problems including Dementia, hypertension, BPH, COPD, vitamin D deficiency, mood disorder, anxiety, history of covid. Mr. Bergland resides at [place] at [facility] Mr. Reuter is an extensive assistance for bed mobility, bathing, dressing, toileting.Mr. Culbreath does require assistance from the staff for feeding. Currently on a regular diet, regular texture, regular liquids consistency with med plus supplement. No recent wound, hospitalizations, falls. Mr. Dolney does remain a full code. Staff endorses no new changes or concerns.  At present, Mr. Crehan is lying in bed, appears comfortable, debilitated, thin, no visitors present. I visited and observed Mr Bencomo, made eye contact, interactive smiling with mumbling words with intermit clear words. Mr. Esguerra attempted to answer questions thought not usually correct answers, pleasant and cooperative for assessment. No meaningful discussion with cognitive impairment. Mr. Harrower appears to be stable today. I have attempted to reach family, unable to leave a message. Emotional support provided. Medical goals reviewed. I updated staff, no new changes recommended today  History obtained from review of EMR, discussion with facility staff and  Mr. Xia.  I reviewed available labs, medications, imaging, studies and related documents from the EMR.  Records reviewed and summarized above.   ROS Full 14 system review of systems performed and negative with exception of: as per HPI.   Physical Exam: Constitutional: NAD General: frail appearing, thin pleasant male EYES: lids intact, ENMT: oral mucous membranes moist CV: S1S2, RRR, no LE edema Pulmonary: LCTA, no increased work of breathing, no cough, room air Abdomen: soft  and non tender MSK:  bed bound, non-ambulatory Skin: warm and dry, no rashes or wounds on visible skin Neuro:  + generalized weakness,  + cognitive impairment Psych: flat affect, A and Oriented to self  Questions and concerns were addressed. . Provided general support and encouragement, no other unmet needs identified   Thank you for the opportunity to participate in the care of Mr. Hagmann.  The palliative care team will continue to follow. Please call our office at [phone] if we can be of additional assistance.   This chart was dictated using voice recognition software.  Despite best efforts to proofread,  errors can occur which can change the documentation meaning.   Rosa Wyly Ihor Gully, NP

## 2021-01-23 ENCOUNTER — Encounter: Payer: Self-pay | Admitting: Nurse Practitioner

## 2021-01-23 ENCOUNTER — Non-Acute Institutional Stay: Payer: Medicare Other | Admitting: Nurse Practitioner

## 2021-01-23 ENCOUNTER — Other Ambulatory Visit: Payer: Self-pay

## 2021-01-23 VITALS — BP 116/62 | HR 78 | Temp 98.0°F | Resp 20 | Wt 108.0 lb

## 2021-01-23 DIAGNOSIS — F1027 Alcohol dependence with alcohol-induced persisting dementia: Secondary | ICD-10-CM

## 2021-01-23 DIAGNOSIS — Z515 Encounter for palliative care: Secondary | ICD-10-CM

## 2021-01-23 DIAGNOSIS — R630 Anorexia: Secondary | ICD-10-CM

## 2021-01-23 NOTE — Progress Notes (Signed)
[place] Consult Note Telephone: [phone]  Fax: [phone]    Date of encounter: 01/23/21 3:32 PM PATIENT NAME: [name] [name] [place] [address]   [phone] (home)  DOB: 1945-07-13 MRN: [id number] PRIMARY CARE PROVIDER:   Dr [facility] Perrin Maltese, [place],  [place] [address] [phone] RESPONSIBLE PARTY:    Contact Information     Name Relation Home Work Mobile   Adams,Gwendolyn Daughter [phone]  [phone]   Jaquann, Guarisco [phone]  [phone]      I met face to face with patient in facility. Palliative Care was asked to follow this patient by consultation request of  Dr Nicole Kindred to address advance care planning and complex medical decision making. This is a follow up visit.                                  ASSESSMENT AND PLAN / RECOMMENDATIONS: Symptom Management/Plan: ACP: Full code, multiple attempts to contact family for discussion of medical goals, code status   2. Palliative care encounter; Palliative care encounter; Palliative medicine team will continue to support patient, patient's family, and medical team. Visit consisted of counseling and education dealing with the complex and emotionally intense issues of symptom management and palliative care in the setting of serious and potentially life-threatening illness   3. Anorexia; continue to encourage to eat, supplement, weights; current weight 108 lbs with BMI 16.4; ongoing weight loss   4. Dementia progressive slow decline, stable at present time; continue supportive measure, ongoing discussions of goc with family.   Follow up Palliative Care Visit: Palliative care will continue to follow for complex medical decision making, advance care planning, and clarification of goals. Return 6 weeks or prn.  I spent 36 minutes providing this consultation starting at 12:20pm. More than 50% of  the time in this consultation was spent in counseling and care coordination. PPS: 30%  Chief Complaint: Follow up palliative consult for complex medical decision making  HISTORY OF PRESENT ILLNESS:  [name] [name] is a 75 y.o. year old male  with multiple medical problems including Dementia, hypertension, BPH, COPD, vitamin D deficiency, mood disorder, anxiety, history of covid. [name] [name] resides at [place] at [facility]. [name] [name] is an extensive assistance for bed mobility, bathing, dressing, toileting.[name] [name] does require assistance from the staff for feeding. Currently on a regular diet, regular texture, regular liquids consistency with med plus supplement. No recent wound, hospitalizations, falls. [name] [name] does remain a full code. Staff endorses no new changes or concerns.  At present, [name] [name] is lying in bed, appears comfortable, debilitated, thin, no visitors present. I visited and observed [name], made eye contact, with clear speech today. [name] [name] endorses "how are you doing" Limited discussion with cognitive impairment. [name] [name] was laughing, smiling throughout PC visit. [name] [name]. [name] [name] and I talked about food he likes to eat. I have attempted to contact family. Will continue to try for discussions of goc. I updated staff.   History obtained from review of EMR, discussion with facility staff and  [name] [name].  I reviewed available labs, medications, imaging, studies and related documents from the EMR.  Records reviewed and summarized above.   ROS Full 10 system review of systems performed and negative with exception of: as per HPI reviewed with staff  Physical Exam:  Constitutional: NAD General: frail appearing, thin, confused, debilitated male EYES: lids intact ENMT: oral mucous membranes moist CV: S1S2, RRR, no LE edema Pulmonary: LCTA, no increased work of breathing, no cough, Abdomen: normo-active BS +  4 quadrants, soft and non tender MSK: bed-bound Skin: warm and dry Neuro:  + generalized weakness,  + cognitive impairment; functionally quadriplegic Psych: non-anxious affect, A and Oriented to self  Questions and concerns were addressed.  Provided general support and encouragement, no other unmet needs identified   Thank you for the opportunity to participate in the care of [name] [name].  The palliative care team will continue to follow. Please call our office at [phone] if we can be of additional assistance.   This chart was dictated using voice recognition software.  Despite best efforts to proofread,  errors can occur which can change the documentation meaning.   [name] [name] Z [name] [name], NPC

## 2021-05-22 ENCOUNTER — Encounter: Payer: Self-pay | Admitting: Nurse Practitioner

## 2021-05-22 ENCOUNTER — Non-Acute Institutional Stay: Payer: Medicare Other | Admitting: Nurse Practitioner

## 2021-05-22 VITALS — BP 128/78 | HR 62 | Resp 18 | Wt 113.8 lb

## 2021-05-22 DIAGNOSIS — F1027 Alcohol dependence with alcohol-induced persisting dementia: Secondary | ICD-10-CM

## 2021-05-22 DIAGNOSIS — R630 Anorexia: Secondary | ICD-10-CM

## 2021-05-22 DIAGNOSIS — Z515 Encounter for palliative care: Secondary | ICD-10-CM

## 2021-05-22 NOTE — Progress Notes (Signed)
[place] Consult Note Telephone: [phone]  Fax: [phone]    Date of encounter: 05/22/21 8:39 PM PATIENT NAME: [name] [name] [id number]   [phone] (home)  DOB: 07-08-1945 MRN: [id number] PRIMARY CARE PROVIDER:    [facility]   RESPONSIBLE PARTY:    Contact Information     Name Relation Home Work Mobile   [name],[name] Daughter [phone]  [phone]   [name], [name] [phone]  [phone]      I met face to face with patient in /facility. Palliative Care was asked to follow this patient by consultation request of  [place] to address advance care planning and complex medical decision making. This is a follow up visit.                                  ASSESSMENT AND PLAN / RECOMMENDATIONS:  Symptom Management/Plan: 1. Advance Care Planning; Full code, call pending to family for further discussion of complex medical decision making, difficulty reaching family   2. Palliative care encounter; Palliative care encounter; Palliative medicine team will continue to support patient, patient's family, and medical team. Visit consisted of counseling and education dealing with the complex and emotionally intense issues of symptom management and palliative care in the setting of serious and potentially life-threatening illness   3. Anorexia; slight improvement; current weight 113.8, slight weight gain. Continue to monitor weights, encourage to eat,   5. Dementia progressive slow decline, stable at present time; continue supportive measure, ongoing discussions of goc with family.      Follow up Palliative Care Visit: Palliative care will continue to follow for complex medical decision making, advance care planning, and clarification of goals. Return 8 weeks or prn.  I spent 42 minutes providing this consultation. More than 50% of the time in this consultation  was spent in counseling and care coordination. PPS: 30%  Chief Complaint: Follow up palliative consult for complex medical decision making  HISTORY OF PRESENT ILLNESS:  [name] [name] is a 76 y.o. year old male  with multiple medical problems including Dementia, hypertension, BPH, COPD, vitamin D deficiency, mood disorder, anxiety, history of covid. [name] [name] resides at [place] at [facility]. [name] [name] is an extensive assistance for bed mobility, bathing, dressing, toileting.[name] [name] does require assistance from the staff for feeding. Currently on a regular diet, regular texture, regular liquids consistency with med plus supplement. No recent wound, hospitalizations, falls. [name] [name] does remain a full code. Staff endorses no new changes or concerns.  At present, [name] [name] is lying in bed, appears comfortable, debilitated, thin, no visitors present. I visited and observed [name], made eye contact, interactive smiling with mumbling words with intermit clear words. [name] [name] attempted to answer questions thought not usually correct answers, pleasant and cooperative for assessment. No meaningful discussion with cognitive impairment. [name] [name] appears to be stable today. I have attempted to reach family, unable to leave a message. Emotional support provided. Medical goals reviewed. I updated staff, no new changes recommended today .   History obtained from review of EMR, discussion with facility staff and [name] [name].  I reviewed available labs, medications, imaging, studies and related documents from the EMR.  Records reviewed and summarized above.   ROS 10 point system reviewed with facility staff as [name] is cognitively impaired all negative except  HPI  Physical Exam: Constitutional: NAD General: frail appearing, thin pleasant male EYES: lids intact, ENMT: oral mucous membranes moist CV: S1S2, RRR, no LE edema Pulmonary: LCTA,  no increased work of breathing, no cough, room air Abdomen: soft and non tender MSK: bed bound, non-ambulatory Skin: warm and dry, no rashes or wounds on visible skin Neuro:  + generalized weakness,  + cognitive impairment Psych: flat affect, A and Oriented to self Thank you for the opportunity to participate in the care of Mr. Wittmeyer.  The palliative care team will continue to follow. Please call our office at [phone] if we can be of additional assistance.   Merlean Pizzini Ihor Gully, NP

## 2021-05-25 ENCOUNTER — Encounter: Payer: Self-pay | Admitting: Nurse Practitioner

## 2021-05-25 ENCOUNTER — Other Ambulatory Visit: Payer: Self-pay

## 2021-05-25 ENCOUNTER — Non-Acute Institutional Stay: Payer: Medicare Other | Admitting: Nurse Practitioner

## 2021-05-25 VITALS — BP 128/78 | HR 78 | Temp 97.9°F | Resp 18 | Wt 113.8 lb

## 2021-05-25 DIAGNOSIS — F1027 Alcohol dependence with alcohol-induced persisting dementia: Secondary | ICD-10-CM

## 2021-05-25 DIAGNOSIS — R630 Anorexia: Secondary | ICD-10-CM

## 2021-05-25 DIAGNOSIS — Z515 Encounter for palliative care: Secondary | ICD-10-CM

## 2021-05-25 NOTE — Progress Notes (Addendum)
[place] Consult Note Telephone: [phone]  Fax: [phone]    Date of encounter: 05/25/21 9:07 PM PATIENT NAME: [name] [name] [place] [address]   [phone] (home)  DOB: 05-27-1945 MRN: [id number] PRIMARY CARE PROVIDER:    [facility]  RESPONSIBLE PARTY:    Contact Information     Name Relation Home Work Mobile   [name] [name] Daughter [phone]  [phone]   [name] [name], [name] [name] [phone]  [phone]      I met face to face with patient in facility. Palliative Care was asked to follow this patient by consultation request of  [place] to address advance care planning and complex medical decision making. This is a follow up visit.                                  ASSESSMENT AND PLAN / RECOMMENDATIONS:  Symptom Management/Plan: 1. Advance Care Planning; Full code, call pending to family for further discussion of complex medical decision making, difficulty reaching family   2. Palliative care encounter; Palliative care encounter; Palliative medicine team will continue to support patient, patient's family, and medical team. Visit consisted of counseling and education dealing with the complex and emotionally intense issues of symptom management and palliative care in the setting of serious and potentially life-threatening illness   3. Anorexia; slight improvement; Continue to monitor weights, encourage to eat, nutrition, supplements,   5. Dementia progressive slow decline, stable at present time; continue supportive measure, ongoing discussions of goc with family.   Follow up Palliative Care Visit: Palliative care will continue to follow for complex medical decision making, advance care planning, and clarification of goals. Return 4 weeks or prn.  I spent 66 minutes providing this consultation. More than 50% of the time in this consultation was spent in counseling  and care coordination. PPS: 30%  Chief Complaint: Follow up palliative consult for complex medical decision making  HISTORY OF PRESENT ILLNESS:  [name] [name] is a 76 y.o. year old male  with multiple medical problems including Dementia, hypertension, BPH, COPD, vitamin D deficiency, mood disorder, anxiety, history of covid. [name] [name] resides at [place] at [facility]. [name] [name] is an extensive assistance for bed mobility, bathing, dressing, toileting.[name] [name] does require assistance from the staff for feeding. Currently on a regular diet, regular texture, regular liquids consistency with med plus supplement. No recent wound, hospitalizations, falls. [name] [name] does remain a full code. Staff endorses no new changes or concerns.  At present, [name] [name] is lying in bed, appears comfortable, debilitated, thin, no visitors present. I visited and observed [name] [name], made eye contact, interactive smiling with mumbling words with intermit clear words. [name] [name] attempted to answer questions thought not usually correct answers, pleasant and cooperative for assessment. No meaningful discussion with cognitive impairment. [name] [name] appears to be stable today. I have attempted to reach family, unable to leave a message. Emotional support provided. I called Ms. Andree Elk, updated on pc visit, plan, medical goals reviewed, support provided. I updated staff, no new changes recommended today .   History obtained from review of EMR, discussion with facility staff and [name] [name].  I reviewed available labs, medications, imaging, studies and related documents from the EMR.  Records reviewed and summarized above.   ROS 10 point system reviewed with staff all negative except HPI  Physical Exam:  Constitutional: NAD General: frail appearing, thin, debilitated confused male EYES:  lids intact ENMT: oral mucous membranes moist CV: S1S2, RRR Pulmonary: LCTA, no  increased work of breathing, no cough, room air Abdomen: normo-active BS + 4 quadrants, soft and non tender MSK: bed-bound;  Skin: warm and dry Neuro:  + generalized weakness,  + cognitive impairment Psych: non-anxious affect, A and Oriented to self Thank you for the opportunity to participate in the care of [name] [name].  The palliative care team will continue to follow. Please call our office at [phone] if we can be of additional assistance.   Kainat Pizana Ihor Gully, NP

## 2021-07-06 ENCOUNTER — Other Ambulatory Visit: Payer: Self-pay

## 2021-07-06 ENCOUNTER — Encounter: Payer: Self-pay | Admitting: Nurse Practitioner

## 2021-07-06 ENCOUNTER — Non-Acute Institutional Stay: Payer: Medicare Other | Admitting: Nurse Practitioner

## 2021-07-06 VITALS — BP 128/78 | HR 80 | Temp 97.0°F | Resp 18 | Wt 107.2 lb

## 2021-07-06 DIAGNOSIS — R630 Anorexia: Secondary | ICD-10-CM

## 2021-07-06 DIAGNOSIS — F1027 Alcohol dependence with alcohol-induced persisting dementia: Secondary | ICD-10-CM

## 2021-07-06 DIAGNOSIS — Z515 Encounter for palliative care: Secondary | ICD-10-CM

## 2021-07-06 NOTE — Progress Notes (Signed)
? ? ?[facility] ?[facility] Consult Note ?Telephone: [phone]  ?Fax: [phone]  ? ? ?Date of encounter: 07/06/21 ?5:25 PM ?PATIENT NAME: [name] [name] ?[address] ?[place] [address]   ?[phone] (home)  ?DOB: 09-Apr-1946 ?MRN: [id number] ?PRIMARY CARE PROVIDER:    ?[facility] ? ?RESPONSIBLE PARTY:    ?Contact Information   ? ? Name Relation Home Work Mobile  ? Adams,Gwendolyn Daughter [phone]  [phone]  ? Ro[place]velt, [place] Daughter [phone]  [phone]  ? ?  ? ?I met face to face with patient in facility. Palliative Care was asked to follow this patient by consultation request of  [place] to address advance care planning and complex medical decision making. This is a follow up visit.                                  ?ASSESSMENT AND PLAN / RECOMMENDATIONS:  ?Symptom Management/Plan: ?1. Advance Care Planning; Full code, call pending to family for further discussion of complex medical decision making, difficulty reaching family ?  ?2. Palliative care encounter; Palliative care encounter; Palliative medicine team will continue to support patient, patient's family, and medical team. Visit consisted of counseling and education dealing with the complex and emotionally intense issues of symptom management and palliative care in the setting of serious and potentially life-threatening illness ?  ?3. Anorexia; slight improvement; Continue to monitor weights, encourage to eat, nutrition, supplement, weights reviewed. Encourage to eat, pending discussions with family about goc, if >10% weight loss maybe option for hospice if goals align with family as currently mr. Droke is a full code, full aggressive interventions ? ?04/22/2021 weight 113.4 lbs ?05/21/2021 weight 113.8 lbs ?07/02/2021 weight 107.2 lbs ?BMI 16.2 lbs; 6.2 lbs loss/4 weeks; 5.80% ?  ?4. Dementia progressive slow decline, stable at present time; continue supportive measure,  ongoing discussions of goc with family.  ? ?Follow up Palliative Care Visit: Palliative care will continue to follow for complex medical decision making, advance care planning, and clarification of goals. Return 4 weeks or prn. ? ?I spent 37 minutes providing this consultation starting at 1:15 pm. More than 50% of the time in this consultation was spent in counseling and care coordination. ? ?PPS: 30% ? ?Chief Complaint: Follow up palliative consult for complex medical decision making ? ?HISTORY OF PRESENT ILLNESS:  [name] [name] is a 76 y.o. year old male  with multiple medical problems including Dementia, hypertension, BPH, COPD, vitamin D deficiency, mood disorder, anxiety, history of covid. [name] [name] resides at [place] at [facility]. [name] [name] is total care for turning/positioning, bathing, dressing, toileting.[name] [name] does require assistance from the staff for feeding. Currently on a regular diet, regular texture, regular liquids consistency with med plus supplement with weight loss. No recent wound, hospitalizations, falls. [name] [name] does remain a full code. Staff endorses no new changes or concerns.  At present, [name] [name] is lying in bed, appears comfortable, sleeping. [name] [name] awoke to verbal commands, makes eye contact, says few clear words. No visitors present. No meaningful discussion with cognitive impairment. Most of PC visit with Mr Kallenbach supportive; remains asymptomatic, medical goals reviewed. No new changes recommended. [name] [name] appears to be stable today. I have attempted to reach family, unable to leave a message. Emotional support provided. I called [name] [name], updated on pc visit, plan, medical goals reviewed, support provided. I updated staff,  no new changes recommended today .  .  ? ?History obtained from review of EMR, discussion with facility staff and Mr. Villamizar.  ?I reviewed available labs, medications, imaging, studies  and related documents from the EMR.  Records reviewed and summarized above.  ? ?ROS ?10 point system reviewed with facility staff as Mr Swayne is cognitively impaired all negative except HPI ? ?Physical Exam: ?Constitutional: NAD ?General: frail appearing, thin, debilitated, cognitively impaired male ?EYES: lids intact ?ENMT: oral mucous membranes moist ?CV: S1S2, RRR ?Pulmonary: clear, decrease bases, no increased work of breathing, no cough, room air ?Abdomen: normo-active BS + 4 quadrants, soft and non tender ?MSK: +muscle wasting; functionally quadriplegic ?Skin: warm and dry ?Neuro:  + generalized weakness,  + cognitive impairment ?Psych: non-anxious affect, Alert, confused ?Thank you for the opportunity to participate in the care of [name] [name].  The palliative care team will continue to follow. Please call our office at [phone] if we can be of additional assistance.  ? ?Samah Lapiana Z Henessy Rohrer, NP    ?

## 2021-07-13 ENCOUNTER — Non-Acute Institutional Stay: Payer: Medicare Other | Admitting: Nurse Practitioner

## 2021-08-10 ENCOUNTER — Encounter: Payer: Self-pay | Admitting: Nurse Practitioner

## 2021-08-10 ENCOUNTER — Non-Acute Institutional Stay: Payer: Medicare Other | Admitting: Nurse Practitioner

## 2021-08-10 VITALS — BP 136/87 | HR 84 | Temp 98.8°F | Resp 18 | Wt 113.0 lb

## 2021-08-10 DIAGNOSIS — F1027 Alcohol dependence with alcohol-induced persisting dementia: Secondary | ICD-10-CM

## 2021-08-10 DIAGNOSIS — R630 Anorexia: Secondary | ICD-10-CM

## 2021-08-10 DIAGNOSIS — Z515 Encounter for palliative care: Secondary | ICD-10-CM

## 2021-08-10 NOTE — Progress Notes (Signed)
? ? ?Manufacturing engineer ?Community Palliative [facility] Consult Note ?Telephone: [phone]  ?Fax: [phone]  ? ? ?Date of encounter: 08/10/21 ?7:40 PM ?PATIENT NAME: [name] [name] ?[address] ?[place] [address]   ?[phone] (home)  ?DOB: 1945-07-11 ?MRN: [id number] ?PRIMARY CARE PROVIDER:    ?[employer] ? ?RESPONSIBLE PARTY:    ?Contact Information   ? ? Name Relation Home Work Mobile  ? Adams,Gwendolyn Daughter [phone]  [phone]  ? Sayvion, Vigen Daughter [phone]  [phone]  ? ?  ? ?I met face to face with patient in facility. Palliative Care was asked to follow this patient by consultation request of  [place] to address advance care planning and complex medical decision making. This is a follow up visit.                                  ?ASSESSMENT AND PLAN / RECOMMENDATIONS:  ?Symptom Management/Plan: ?1. Advance Care Planning; Full code, call pending to family for further discussion of complex medical decision making, difficulty reaching family ?  ?2. Palliative care encounter; Palliative care encounter; Palliative medicine team will continue to support patient, patient's family, and medical team. Visit consisted of counseling and education dealing with the complex and emotionally intense issues of symptom management and palliative care in the setting of serious and potentially life-threatening illness ?  ?3. Anorexia; slight improvement; Continue to monitor weights, encourage to eat, nutrition, supplement, weights reviewed. Encourage to eat, pending discussions with family about goc, if >10% weight loss maybe option for hospice if goals align with family as currently mr. Delvecchio is a full code, full aggressive interventions ?  ?04/22/2021 weight 113.4 lbs ?05/21/2021 weight 113.8 lbs ?07/02/2021 weight 107.2 lbs ?07/22/2021 weight 113 lbs ?  ?4. Dementia progressive slow decline, stable at present time; continue supportive measure, ongoing discussions  of goc with family.  ? ?Follow up Palliative Care Visit: Palliative care will continue to follow for complex medical decision making, advance care planning, and clarification of goals. Return 4 weeks or prn. ? ?I spent 34 minutes providing this consultation. More than 50% of the time in this consultation was spent in counseling and care coordination. ? ?PPS: 30% ? ?Chief Complaint: Follow up palliative consult for complex medical decision making ? ?HISTORY OF PRESENT ILLNESS:  [name] [name] is a 76 y.o. year old male  with multiple medical problems including Dementia, hypertension, BPH, COPD, vitamin D deficiency, mood disorder, anxiety, history of covid. Mr. [name] [name] resides at [place]. Mr. [name] [name] is total care for turning/positioning, bathing, dressing, toileting.Mr. [name] [name] does require assistance from the staff for feeding. Currently on a regular diet, regular texture, regular liquids consistency with med plus supplement with weight loss. No recent wound, hospitalizations, falls. Mr. [name] [name] does remain a full code. Staff endorses no new changes or concerns.  At present, Mr. [name] [name] is lying in bed, appears comfortable, sleeping. [name] awoke to verbal commands, makes eye contact, says few clear words. No visitors present. No meaningful discussion with cognitive impairment. Most of PC visit with [name] supportive; remains asymptomatic, medical goals reviewed. No new changes recommended. [name] [name] appears to be stable today. I have attempted to reach family, unable to leave a message; very difficulty. I updated staff, no new changes recommended today .    ? ?History obtained from review of EMR, discussion with facility staff and Mr.  Glennon [name].  ?I reviewed available labs, medications, imaging, studies and related documents from the EMR.  Records reviewed and summarized above.  ? ?ROS ?10 point system reviewed with staff as [name] [name] is  cognitively impaired ? ?Physical Exam: ?Constitutional: NAD ?General: frail appearing, thin, debilitated, cognitively impaired male ?EYES:  lids intact ?ENMT: oral mucous membranes moist ?CV: S1S2, RRR ?Pulmonary: LCTA, no increased work of breathing, no cough, room air ?Abdomen: normo-active BS + 4 quadrants, soft and non tender ?MSK: bed-bound; muscle wasting ?Skin: warm and dry ?Neuro:  + generalized weakness,  + cognitive impairment ?Psych: non-anxious affect, A and Oriented to self ?Thank you for the opportunity to participate in the care of [name] [name].  The palliative care team will continue to follow. Please call our office at [phone] if we can be of additional assistance.  ? ?Elleanna Melling Z Tennie Grussing, NP  ? ?  ?

## 2021-09-11 ENCOUNTER — Emergency Department: Payer: Medicare Other

## 2021-09-11 ENCOUNTER — Inpatient Hospital Stay
Admission: EM | Admit: 2021-09-11 | Discharge: 2021-09-18 | DRG: 871 | Disposition: A | Discharge status: Skilled Nursing Facility | Payer: Medicare Other | Source: Skilled Nursing Facility | Attending: Internal Medicine | Admitting: Internal Medicine

## 2021-09-11 ENCOUNTER — Other Ambulatory Visit: Payer: Self-pay

## 2021-09-11 DIAGNOSIS — N179 Acute kidney failure, unspecified: Secondary | ICD-10-CM | POA: Diagnosis present

## 2021-09-11 DIAGNOSIS — Z87891 Personal history of nicotine dependence: Secondary | ICD-10-CM | POA: Diagnosis not present

## 2021-09-11 DIAGNOSIS — Z7951 Long term (current) use of inhaled steroids: Secondary | ICD-10-CM

## 2021-09-11 DIAGNOSIS — F0390 Unspecified dementia without behavioral disturbance: Secondary | ICD-10-CM | POA: Diagnosis present

## 2021-09-11 DIAGNOSIS — Z79899 Other long term (current) drug therapy: Secondary | ICD-10-CM | POA: Diagnosis not present

## 2021-09-11 DIAGNOSIS — E872 Acidosis, unspecified: Secondary | ICD-10-CM | POA: Diagnosis present

## 2021-09-11 DIAGNOSIS — Z681 Body mass index (BMI) 19 or less, adult: Secondary | ICD-10-CM

## 2021-09-11 DIAGNOSIS — R6520 Severe sepsis without septic shock: Secondary | ICD-10-CM

## 2021-09-11 DIAGNOSIS — G9341 Metabolic encephalopathy: Secondary | ICD-10-CM | POA: Diagnosis present

## 2021-09-11 DIAGNOSIS — N400 Enlarged prostate without lower urinary tract symptoms: Secondary | ICD-10-CM | POA: Diagnosis present

## 2021-09-11 DIAGNOSIS — E43 Unspecified severe protein-calorie malnutrition: Secondary | ICD-10-CM | POA: Diagnosis present

## 2021-09-11 DIAGNOSIS — Z66 Do not resuscitate: Secondary | ICD-10-CM | POA: Diagnosis present

## 2021-09-11 DIAGNOSIS — K4090 Unilateral inguinal hernia, without obstruction or gangrene, not specified as recurrent: Secondary | ICD-10-CM | POA: Diagnosis present

## 2021-09-11 DIAGNOSIS — J690 Pneumonitis due to inhalation of food and vomit: Secondary | ICD-10-CM | POA: Diagnosis present

## 2021-09-11 DIAGNOSIS — E559 Vitamin D deficiency, unspecified: Secondary | ICD-10-CM | POA: Diagnosis present

## 2021-09-11 DIAGNOSIS — H44002 Unspecified purulent endophthalmitis, left eye: Secondary | ICD-10-CM | POA: Diagnosis present

## 2021-09-11 DIAGNOSIS — J449 Chronic obstructive pulmonary disease, unspecified: Secondary | ICD-10-CM | POA: Diagnosis present

## 2021-09-11 DIAGNOSIS — F419 Anxiety disorder, unspecified: Secondary | ICD-10-CM | POA: Diagnosis present

## 2021-09-11 DIAGNOSIS — J9601 Acute respiratory failure with hypoxia: Secondary | ICD-10-CM | POA: Diagnosis present

## 2021-09-11 DIAGNOSIS — J441 Chronic obstructive pulmonary disease with (acute) exacerbation: Secondary | ICD-10-CM | POA: Diagnosis present

## 2021-09-11 DIAGNOSIS — E876 Hypokalemia: Secondary | ICD-10-CM

## 2021-09-11 DIAGNOSIS — I10 Essential (primary) hypertension: Secondary | ICD-10-CM | POA: Diagnosis present

## 2021-09-11 DIAGNOSIS — A419 Sepsis, unspecified organism: Secondary | ICD-10-CM | POA: Diagnosis present

## 2021-09-11 HISTORY — DX: Hypokalemia: E87.6

## 2021-09-11 LAB — CBC WITH DIFFERENTIAL/PLATELET
Abs Immature Granulocytes: 0.36 10*3/uL — ABNORMAL HIGH (ref 0.00–0.07)
Basophils Absolute: 0.1 10*3/uL (ref 0.0–0.1)
Basophils Relative: 0 %
Eosinophils Absolute: 0.1 10*3/uL (ref 0.0–0.5)
Eosinophils Relative: 0 %
HCT: 52.5 % — ABNORMAL HIGH (ref 39.0–52.0)
Hemoglobin: 16.6 g/dL (ref 13.0–17.0)
Immature Granulocytes: 2 %
Lymphocytes Relative: 17 %
Lymphs Abs: 3.4 10*3/uL (ref 0.7–4.0)
MCH: 28.3 pg (ref 26.0–34.0)
MCHC: 31.6 g/dL (ref 30.0–36.0)
MCV: 89.4 fL (ref 80.0–100.0)
Monocytes Absolute: 1 10*3/uL (ref 0.1–1.0)
Monocytes Relative: 5 %
Neutro Abs: 15.2 10*3/uL — ABNORMAL HIGH (ref 1.7–7.7)
Neutrophils Relative %: 76 %
Platelets: 312 10*3/uL (ref 150–400)
RBC: 5.87 MIL/uL — ABNORMAL HIGH (ref 4.22–5.81)
RDW: 12.5 % (ref 11.5–15.5)
WBC: 20.1 10*3/uL — ABNORMAL HIGH (ref 4.0–10.5)
nRBC: 0 % (ref 0.0–0.2)

## 2021-09-11 LAB — BLOOD GAS, ARTERIAL
Acid-Base Excess: 1.7 mmol/L (ref 0.0–2.0)
Bicarbonate: 27.7 mmol/L (ref 20.0–28.0)
Delivery systems: POSITIVE
Expiratory PAP: 6 cmH2O
FIO2: 100 %
Inspiratory PAP: 14 cmH2O
O2 Saturation: 100 %
Patient temperature: 37
pCO2 arterial: 48 mmHg (ref 32–48)
pH, Arterial: 7.37 (ref 7.35–7.45)
pO2, Arterial: 454 mmHg — ABNORMAL HIGH (ref 83–108)

## 2021-09-11 LAB — BLOOD GAS, VENOUS
Acid-base deficit: 13.2 mmol/L — ABNORMAL HIGH (ref 0.0–2.0)
Bicarbonate: 16.8 mmol/L — ABNORMAL LOW (ref 20.0–28.0)
O2 Content: 15 L/min
O2 Saturation: 71.9 %
Patient temperature: 37
pCO2, Ven: 53 mmHg (ref 44–60)
pH, Ven: 7.11 — CL (ref 7.25–7.43)
pO2, Ven: 48 mmHg — ABNORMAL HIGH (ref 32–45)

## 2021-09-11 LAB — URINALYSIS, COMPLETE (UACMP) WITH MICROSCOPIC
Bilirubin Urine: NEGATIVE
Glucose, UA: 50 mg/dL — AB
Hgb urine dipstick: NEGATIVE
Ketones, ur: NEGATIVE mg/dL
Nitrite: NEGATIVE
Protein, ur: 100 mg/dL — AB
Specific Gravity, Urine: 1.017 (ref 1.005–1.030)
pH: 5 (ref 5.0–8.0)

## 2021-09-11 LAB — LACTIC ACID, PLASMA
Lactic Acid, Venous: >9 mmol/L (ref 0.5–1.9)
Lactic Acid, Venous: 5.4 mmol/L (ref 0.5–1.9)

## 2021-09-11 LAB — COMPREHENSIVE METABOLIC PANEL
ALT: 18 U/L (ref 0–44)
AST: 44 U/L — ABNORMAL HIGH (ref 15–41)
Albumin: 3.4 g/dL — ABNORMAL LOW (ref 3.5–5.0)
Alkaline Phosphatase: 101 U/L (ref 38–126)
Anion gap: 22 — ABNORMAL HIGH (ref 5–15)
BUN: 16 mg/dL (ref 8–23)
CO2: 16 mmol/L — ABNORMAL LOW (ref 22–32)
Calcium: 9.7 mg/dL (ref 8.9–10.3)
Chloride: 104 mmol/L (ref 98–111)
Creatinine, Ser: 1.38 mg/dL — ABNORMAL HIGH (ref 0.61–1.24)
GFR, Estimated: 53 mL/min — ABNORMAL LOW (ref >60)
Glucose, Bld: 270 mg/dL — ABNORMAL HIGH (ref 70–99)
Potassium: 3.2 mmol/L — ABNORMAL LOW (ref 3.5–5.1)
Sodium: 142 mmol/L (ref 135–145)
Total Bilirubin: 0.6 mg/dL (ref 0.3–1.2)
Total Protein: 7.6 g/dL (ref 6.5–8.1)

## 2021-09-11 LAB — TROPONIN I (HIGH SENSITIVITY)
Troponin I (High Sensitivity): 57 ng/L — ABNORMAL HIGH (ref <18)
Troponin I (High Sensitivity): 31 ng/L — ABNORMAL HIGH (ref <18)

## 2021-09-11 LAB — PROTIME-INR
INR: 1.2 (ref 0.8–1.2)
Prothrombin Time: 15.3 seconds — ABNORMAL HIGH (ref 11.4–15.2)

## 2021-09-11 LAB — MAGNESIUM: Magnesium: 2.4 mg/dL (ref 1.7–2.4)

## 2021-09-11 LAB — APTT: aPTT: 39 seconds — ABNORMAL HIGH (ref 24–36)

## 2021-09-11 LAB — BRAIN NATRIURETIC PEPTIDE: B Natriuretic Peptide: 21.6 pg/mL (ref 0.0–100.0)

## 2021-09-11 MED ORDER — LACTATED RINGERS IV SOLN: INTRAVENOUS | Status: DC

## 2021-09-11 MED ORDER — BUDESONIDE 0.5 MG/2ML IN SUSP
2.0000 mg | Freq: Two times a day (BID) | RESPIRATORY_TRACT | Status: DC
  Administered 2021-09-11: 2 mg via RESPIRATORY_TRACT
  Filled 2021-09-11 (×2): qty 8

## 2021-09-11 MED ORDER — VANCOMYCIN HCL IN DEXTROSE 1-5 GM/200ML-% IV SOLN
1000.0000 mg | Freq: Once | INTRAVENOUS | Status: AC
  Administered 2021-09-11: 1000 mg via INTRAVENOUS
  Filled 2021-09-11: qty 200

## 2021-09-11 MED ORDER — SERTRALINE HCL 50 MG PO TABS
50.0000 mg | ORAL_TABLET | Freq: Every day | ORAL | Status: DC
  Administered 2021-09-13 – 2021-09-18 (×6): 50 mg via ORAL
  Filled 2021-09-11 (×6): qty 1

## 2021-09-11 MED ORDER — DONEPEZIL HCL 5 MG PO TABS
10.0000 mg | ORAL_TABLET | Freq: Every day | ORAL | Status: DC
  Administered 2021-09-13 – 2021-09-18 (×6): 10 mg via ORAL
  Filled 2021-09-11 (×7): qty 2

## 2021-09-11 MED ORDER — IPRATROPIUM-ALBUTEROL 0.5-2.5 (3) MG/3ML IN SOLN
3.0000 mL | Freq: Once | RESPIRATORY_TRACT | Status: AC
Start: 2021-09-11 — End: 2021-09-11
  Administered 2021-09-11: 3 mL via RESPIRATORY_TRACT
  Filled 2021-09-11: qty 3

## 2021-09-11 MED ORDER — SODIUM CHLORIDE 0.9 % IV SOLN
2.0000 g | Freq: Once | INTRAVENOUS | Status: AC
  Administered 2021-09-11: 2 g via INTRAVENOUS
  Filled 2021-09-11: qty 12.5

## 2021-09-11 MED ORDER — SODIUM CHLORIDE 0.9 % IV SOLN
500.0000 mg | Freq: Once | INTRAVENOUS | Status: AC
  Administered 2021-09-11: 500 mg via INTRAVENOUS
  Filled 2021-09-11: qty 5

## 2021-09-11 MED ORDER — SENNA 8.6 MG PO TABS
1.0000 | ORAL_TABLET | Freq: Every day | ORAL | Status: DC
  Administered 2021-09-13 – 2021-09-18 (×5): 8.6 mg via ORAL
  Filled 2021-09-11 (×5): qty 1

## 2021-09-11 MED ORDER — PANTOPRAZOLE SODIUM 40 MG PO TBEC
40.0000 mg | DELAYED_RELEASE_TABLET | Freq: Every day | ORAL | Status: DC
  Administered 2021-09-13 – 2021-09-17 (×5): 40 mg via ORAL
  Filled 2021-09-11 (×5): qty 1

## 2021-09-11 MED ORDER — METRONIDAZOLE 500 MG/100ML IV SOLN
500.0000 mg | Freq: Once | INTRAVENOUS | Status: AC
  Administered 2021-09-11: 500 mg via INTRAVENOUS
  Filled 2021-09-11: qty 100

## 2021-09-11 MED ORDER — ONDANSETRON HCL 4 MG PO TABS: 4.0000 mg | ORAL_TABLET | Freq: Four times a day (QID) | ORAL | Status: DC | PRN

## 2021-09-11 MED ORDER — ENOXAPARIN SODIUM 40 MG/0.4ML IJ SOSY
40.0000 mg | PREFILLED_SYRINGE | INTRAMUSCULAR | Status: DC
  Administered 2021-09-11 – 2021-09-17 (×7): 40 mg via SUBCUTANEOUS
  Filled 2021-09-11 (×7): qty 0.4

## 2021-09-11 MED ORDER — LORAZEPAM 2 MG/ML IJ SOLN
1.0000 mg | Freq: Once | INTRAMUSCULAR | Status: AC
  Administered 2021-09-11: 1 mg via INTRAVENOUS
  Filled 2021-09-11: qty 1

## 2021-09-11 MED ORDER — LACTATED RINGERS IV BOLUS (SEPSIS)
1000.0000 mL | Freq: Once | INTRAVENOUS | Status: AC
  Administered 2021-09-11: 1000 mL via INTRAVENOUS

## 2021-09-11 MED ORDER — ONDANSETRON HCL 4 MG/2ML IJ SOLN: 4.0000 mg | Freq: Four times a day (QID) | INTRAMUSCULAR | Status: DC | PRN

## 2021-09-11 MED ORDER — LACTATED RINGERS IV BOLUS
1000.0000 mL | Freq: Once | INTRAVENOUS | Status: AC
  Administered 2021-09-11: 1000 mL via INTRAVENOUS

## 2021-09-11 MED ORDER — TAMSULOSIN HCL 0.4 MG PO CAPS
0.4000 mg | ORAL_CAPSULE | Freq: Every day | ORAL | Status: DC
  Administered 2021-09-13 – 2021-09-18 (×6): 0.4 mg via ORAL
  Filled 2021-09-11 (×6): qty 1

## 2021-09-11 MED ORDER — ACETAMINOPHEN 325 MG PO TABS: 650.0000 mg | ORAL_TABLET | ORAL | Status: DC | PRN

## 2021-09-11 MED ORDER — IPRATROPIUM-ALBUTEROL 0.5-2.5 (3) MG/3ML IN SOLN
3.0000 mL | Freq: Four times a day (QID) | RESPIRATORY_TRACT | Status: DC | PRN
  Administered 2021-09-12 – 2021-09-14 (×2): 3 mL via RESPIRATORY_TRACT
  Filled 2021-09-11 (×2): qty 3

## 2021-09-11 MED ORDER — POTASSIUM CHLORIDE 2 MEQ/ML IV SOLN
INTRAVENOUS | Status: DC
  Filled 2021-09-11 (×9): qty 1000

## 2021-09-11 MED ORDER — SODIUM CHLORIDE 0.9 % IV SOLN
3.0000 g | Freq: Four times a day (QID) | INTRAVENOUS | Status: DC
  Administered 2021-09-11 – 2021-09-16 (×19): 3 g via INTRAVENOUS
  Filled 2021-09-11: qty 8
  Filled 2021-09-11: qty 3
  Filled 2021-09-11 (×5): qty 8
  Filled 2021-09-11: qty 3
  Filled 2021-09-11 (×4): qty 8
  Filled 2021-09-11 (×3): qty 3
  Filled 2021-09-11 (×2): qty 8
  Filled 2021-09-11: qty 3
  Filled 2021-09-11: qty 8
  Filled 2021-09-11: qty 3
  Filled 2021-09-11: qty 8

## 2021-09-11 NOTE — Assessment & Plan Note (Addendum)
Present on admission as evidenced by tachycardia, tachypnea, leukocytosis and marked lactic acidosiswhich improved.  Source likely aspiration pneumonia given patient's mental status changes and history of dementia, possible UTI. Urine culture with pan-sensitive E. Coli. Blood cultures negative, final. Sepsis physiology improved. --Treated with 5 days Unasyn  --Monitor fever curve, CBC, hemodynamics

## 2021-09-11 NOTE — ED Provider Notes (Signed)
Consider  [facility] Provider Note    Event Date/Time   First MD Initiated Contact with Patient 09/11/21 [phone]     (approximate)   History   Respiratory Distress   HPI  Riku Yohey is a 76 y.o. male  with pmh Dementia, hypertension, BPH, COPD, vitamin D deficiency, mood disorder, anxiety who presents with respiratory distress.  Patient is brought in from his facility.  He was found dyspneic today.  Unable to obtain any history from the patient due to altered mental status.  On review of prior notes looks like patient does have dementia at baseline and does not converse much.  Patient's daughter is at bedside says that typically he is able to talk somewhat and that his mental status currently is worse than normal.  Currently he is full code, but they are discussing goals of care.     Past Medical History:  Diagnosis Date   COPD (chronic obstructive pulmonary disease) (HCC)    Male erectile disorder     Patient Active Problem List   Diagnosis Date Noted   Sepsis (HCC) 02/08/2017   UTI (urinary tract infection) 02/08/2017   Alcoholism (HCC) 02/08/2017     Physical Exam  Triage Vital Signs: ED Triage Vitals  Enc Vitals Group     BP 09/11/21 0839 130/88     Pulse Rate 09/11/21 0839 (!) 140     Resp 09/11/21 0839 (!) 25     Temp --      Temp src --      SpO2 09/11/21 0839 (!) 88 %     Weight 09/11/21 0835 113 lb 1.5 oz (51.3 kg)     Height 09/11/21 0835 5\' 6"  (1.676 m)     Head Circumference --      Peak Flow --      Pain Score 09/11/21 0835 0     Pain Loc --      Pain Edu? --      Excl. in GC? --     Most recent vital signs: Vitals:   09/11/21 1215 09/11/21 1245  BP: (!) 135/113 (!) 166/104  Pulse: (!) 57 (!) 108  Resp: (!) 24 (!) 28  Temp:    SpO2: 90% 100%     General: Awake, patient is thin and ill-appearing he is tachypneic CV:  No lower extremity edema, bilateral feet are cold, no palpable pulses, calves are  warm Resp:  Patient has increased work of breathing with accessory muscle use tachypnea and poor air movement with wheezing Abd:  Patient groans with palpation of the abdomen is not distended abdominal muscles are hard Neuro:             Patient opens his eyes to voice, does not follow commands, speech is incomprehensible Other:     ED Results / Procedures / Treatments  Labs (all labs ordered are listed, but only abnormal results are displayed) Labs Reviewed  BLOOD GAS, VENOUS - Abnormal; Notable for the following components:      Result Value   pH, Ven 7.11 (*)    pO2, Ven 48 (*)    Bicarbonate 16.8 (*)    Acid-base deficit 13.2 (*)    All other components within normal limits  LACTIC ACID, PLASMA - Abnormal; Notable for the following components:   Lactic Acid, Venous >9.0 (*)    All other components within normal limits  LACTIC ACID, PLASMA - Abnormal; Notable for the following components:   Lactic Acid, Venous  5.4 (*)    All other components within normal limits  COMPREHENSIVE METABOLIC PANEL - Abnormal; Notable for the following components:   Potassium 3.2 (*)    CO2 16 (*)    Glucose, Bld 270 (*)    Creatinine, Ser 1.38 (*)    Albumin 3.4 (*)    AST 44 (*)    GFR, Estimated 53 (*)    Anion gap 22 (*)    All other components within normal limits  CBC WITH DIFFERENTIAL/PLATELET - Abnormal; Notable for the following components:   WBC 20.1 (*)    RBC 5.87 (*)    HCT 52.5 (*)    Neutro Abs 15.2 (*)    Abs Immature Granulocytes 0.36 (*)    All other components within normal limits  PROTIME-INR - Abnormal; Notable for the following components:   Prothrombin Time 15.3 (*)    All other components within normal limits  APTT - Abnormal; Notable for the following components:   aPTT 39 (*)    All other components within normal limits  URINALYSIS, COMPLETE (UACMP) WITH MICROSCOPIC - Abnormal; Notable for the following components:   Color, Urine YELLOW (*)    APPearance CLOUDY  (*)    Glucose, UA 50 (*)    Protein, ur 100 (*)    Leukocytes,Ua TRACE (*)    Bacteria, UA RARE (*)    All other components within normal limits  TROPONIN I (HIGH SENSITIVITY) - Abnormal; Notable for the following components:   Troponin I (High Sensitivity) 57 (*)    All other components within normal limits  TROPONIN I (HIGH SENSITIVITY) - Abnormal; Notable for the following components:   Troponin I (High Sensitivity) 31 (*)    All other components within normal limits  CULTURE, BLOOD (ROUTINE X 2)  CULTURE, BLOOD (ROUTINE X 2)  URINE CULTURE  BRAIN NATRIURETIC PEPTIDE     EKG  EKG interpretation performed by myself somewhat limited secondary to baseline artifact however does show sinus tachycardia, question some ST elevation in inferior leads, left axis deviation   RADIOLOGY I reviewed and interpreted the chest x-ray which shows a questionable left middle lobe infiltrate   PROCEDURES:  Critical Care performed: Yes, see critical care procedure note(s)  .1-3 Lead EKG Interpretation Performed by: Georga Hacking, MD Authorized by: Georga Hacking, MD     Interpretation: abnormal     ECG rate assessment: tachycardic     Rhythm: sinus tachycardia     Ectopy: none     Conduction: normal   .Critical Care Performed by: Georga Hacking, MD Authorized by: Georga Hacking, MD   Critical care provider statement:    Critical care time (minutes):  75   Critical care was time spent personally by me on the following activities:  Development of treatment plan with patient or surrogate, discussions with consultants, evaluation of patient's response to treatment, examination of patient, ordering and review of laboratory studies, ordering and review of radiographic studies, ordering and performing treatments and interventions, pulse oximetry, re-evaluation of patient's condition and review of old charts  The patient is on the cardiac monitor to evaluate for evidence of  arrhythmia and/or significant heart rate changes.   MEDICATIONS ORDERED IN ED: Medications  lactated ringers infusion ( Intravenous New Bag/Given 09/11/21 1200)  metroNIDAZOLE (FLAGYL) IVPB 500 mg (500 mg Intravenous New Bag/Given 09/11/21 1209)  ipratropium-albuterol (DUONEB) 0.5-2.5 (3) MG/3ML nebulizer solution 3 mL (3 mLs Nebulization Given 09/11/21 0858)  ipratropium-albuterol (DUONEB) 0.5-2.5 (3) MG/3ML  nebulizer solution 3 mL (3 mLs Nebulization Given 09/11/21 0858)  ipratropium-albuterol (DUONEB) 0.5-2.5 (3) MG/3ML nebulizer solution 3 mL (3 mLs Nebulization Given 09/11/21 0916)  lactated ringers bolus 1,000 mL (0 mLs Intravenous Stopped 09/11/21 1055)  vancomycin (VANCOCIN) IVPB 1000 mg/200 mL premix (0 mg Intravenous Stopped 09/11/21 1156)  ceFEPIme (MAXIPIME) 2 g in sodium chloride 0.9 % 100 mL IVPB (0 g Intravenous Stopped 09/11/21 0936)  azithromycin (ZITHROMAX) 500 mg in sodium chloride 0.9 % 250 mL IVPB (0 mg Intravenous Stopped 09/11/21 1055)  lactated ringers bolus 1,000 mL (1,000 mLs Intravenous New Bag/Given 09/11/21 1055)     IMPRESSION / MDM / ASSESSMENT AND PLAN / ED COURSE  I reviewed the triage vital signs and the nursing notes.                              Differential diagnosis includes, but is not limited to, COPD exacerbation, pneumonia, PE, metabolic acidosis  The patient is a 76 year old male who is chronically ill who presents in respiratory distress.  He is ill-appearing on arrival on a nonrebreather difficult to obtain sats.  Extremities are cold.  He opens his eyes to voice has some incomprehensible speech and does not follow commands.  He has increased work of breathing so was placed on BiPAP to temporize.  His abdomen is not obviously rigid but he does appear to grimace when I palpate on it.  No signs of skin or soft tissue infection.  Chest x-ray read by radiology as a possible left lower lobe infiltrate.  Patient's labs are concerning he has a metabolic acidosis  that is not properly compensated for with a pH of 711 PCO2 of 53 he has a leukocytosis to almost 20 and a lactate over 9.  He is also doubled his creatinine to 1.4.  We will obtain a CT head chest abdomen pelvis to further delineate any pneumonia versus other infectious source.  Did give DuoNebs cefepime Vanco azithromycin to cover for pneumonia.  2 daughters are at bedside.  I had a long discussion with them about his critical illness and likelihood to need to make a decision about intubation versus not.  Currently he is holding his own on BiPAP but his mental status is not great and he is at risk for aspiration.  They are thinking this over.  I am concerned that with his general unwell health that he will be difficult to wean off the ventilator and this is probably not the best decision for him.  Continue to treat with antibiotics fluids and vasopressors as needed in the meantime.  CT abdomen pelvis does not show any acute pathology.  There is a right inguinal hernia with dense fluid.  On bedside exam this is reducible.  Patient does appear somewhat uncomfortable not clear exactly what is causing this.  Other causes a severe lactic acidosis such as mesenteric ischemia but there are no secondary signs on CT abdomen patient not clearly an abdominal pain.  Is not on metformin.  Pressure is okay but he is poorly perfused.  Looked back at most recent palliative care visit and his blood pressure is about what it is today 130/80.  We will add on Flagyl for full coverage, as CT of the chest did not show an obvious pneumonia.  Consider meningitis/encephalitis since still certainly possible.  May need to consider LP as an inpatient if no other source.  Blood cultures are pending.  UA  also is in process.  UA has some WBCs.  He is being covered for potential UTI urine culture sent.  Patient's 3 daughters are at bedside.  Had a long discussion with them about the gravity of his condition.  They ultimately agreed that he  will be DNR/DNI but they would like all additional measures to be taken.  At this time with the acidosis think that he is best to be kept on BiPAP.  Will discuss with hospitalist for admission.      FINAL CLINICAL IMPRESSION(S) / ED DIAGNOSES   Final diagnoses:  Metabolic acidosis  Sepsis, due to unspecified organism, unspecified whether acute organ dysfunction present [facility])     Rx / DC Orders   ED Discharge Orders     None        Note:  This document was prepared using Dragon voice recognition software and may include unintentional dictation errors.   Georga Hacking, MD 09/11/21 1305    Georga Hacking, MD 09/13/21 [phone]

## 2021-09-11 NOTE — H&P (Signed)
History and Physical    [name] [id number] DOB: Mar 16, 1946 DOA: 09/11/2021 DOS: the patient was seen and examined on 09/11/2021 PCP: Perrin Maltese, MD  Patient coming from: Home  Chief Complaint:  Chief Complaint  Patient presents with   Respiratory Distress    Most of the history was obtained from patient's family at the bedside and EMR.  Patient is unable to provide any history HPI: [name] [name] is a 76 y.o. male with medical history significant for dementia, hypertension and COPD who was brought into the ER by EMS from [facility] for evaluation of decreased responsiveness and respiratory distress. Per EMS he was tachycardic with heart rate of 140, hypotensive with systolic blood pressure in the 80s and blood sugar was 252. Patient's daughter states that she was called on the morning of his admission by the nursing home staff to inform her that there was sending him to the ER for evaluation of respiratory distress. Daughter states that he has a known history of COPD but does not wear oxygen.  They have not seen him recently due to Peto concerns but have FaceTimed with him. Initial blood gas in the ER showed uncompensated respiratory acidosis and patient was placed on a BiPAP. Labs also showed marked leukocytosis, marked lactic acidosis concerning for possible sepsis of unclear etiology. Patient received 2 L IV fluid bolus as well as empiric antibiotic therapy with cefepime and Zithromax.  He will be admitted to the hospital for further evaluation Review of Systems: unable to review all systems due to the inability of the patient to answer questions. Past Medical History:  Diagnosis Date   COPD (chronic obstructive pulmonary disease) ([place])    Male erectile disorder    Past Surgical History:  Procedure Laterality Date   KNEE SURGERY Right    Social History:  reports that he has quit smoking. He has never used smokeless tobacco. He reports that he does  not currently use alcohol after a past usage of about 28.0 standard drinks per week. He reports that he does not use drugs.  No Known Allergies  Family History  Problem Relation Age of Onset   Hypertension Mother     Prior to Admission medications   Medication Sig Start Date End Date Taking? Authorizing Provider  budesonide-formoterol (SYMBICORT) 160-4.5 MCG/ACT inhaler Inhale 1 puff into the lungs 2 (two) times daily.   Yes [provider]  donepezil (ARICEPT) 10 MG tablet Take 10 mg by mouth daily.   Yes [provider]  senna (SENOKOT) 8.6 MG TABS tablet Take 1 tablet by mouth daily.   Yes [provider]  sertraline (ZOLOFT) 50 MG tablet Take 50 mg by mouth daily.   Yes [provider]  tamsulosin (FLOMAX) 0.4 MG CAPS capsule Take 0.4 mg by mouth daily.   Yes [provider]  Vitamin D, Ergocalciferol, (DRISDOL) 1.25 MG (50000 UNIT) CAPS capsule Take 50,000 Units by mouth every 7 (seven) days.   Yes [provider]  acetaminophen (TYLENOL) 325 MG tablet Take 650 mg by mouth every 4 (four) hours as needed for mild pain or moderate pain.    [provider]  albuterol (PROVENTIL HFA;VENTOLIN HFA) 108 (90 Base) MCG/ACT inhaler Inhale 2 puffs into the lungs every 6 (six) hours as needed for wheezing or shortness of breath. Patient not taking: Reported on 09/11/2021 05/22/16   Rudene Re, MD  bisacodyl (DULCOLAX) 5 MG EC tablet Take 10 mg by mouth daily as needed for moderate  constipation or mild constipation. Patient not taking: Reported on 09/11/2021    [provider]  docusate sodium (COLACE) 100 MG capsule Take 1 capsule (100 mg total) by mouth 2 (two) times daily as needed for mild constipation. Patient not taking: Reported on 09/11/2021 02/11/17   Henreitta Leber, MD  folic acid (FOLVITE) 1 MG tablet Take 1 tablet (1 mg total) by mouth daily. Patient not taking: Reported on 09/11/2021 02/12/17   Henreitta Leber,  MD  ipratropium-albuterol (DUONEB) 0.5-2.5 (3) MG/3ML SOLN Take 3 mLs by nebulization every 6 (six) hours as needed (COPD symptoms).    [provider]  Multiple Vitamin (MULTIVITAMIN WITH MINERALS) TABS tablet Take 1 tablet by mouth daily. Patient not taking: Reported on 09/11/2021 02/12/17   Henreitta Leber, MD  Multiple Vitamins-Minerals (MULTIVITAMIN WITH MINERALS) tablet Take 1 tablet by mouth daily. Patient not taking: Reported on 09/11/2021    [provider]  omeprazole (PRILOSEC) 20 MG capsule Take 20 mg by mouth daily. 08/18/21   [provider]  tolnaftate (TINACTIN) 1 % cream Apply 1 application topically 2 (two) times daily. (apply to left front groin and flank) Patient not taking: Reported on 09/11/2021    [provider]    Physical Exam: Vitals:   09/11/21 1315 09/11/21 1345 09/11/21 1350 09/11/21 1400  BP: (!) 155/102 126/89  105/81  Pulse: (!) 102 100 (!) 101 95  Resp: (!) 29 17 18 17   Temp:      TempSrc:      SpO2: 99% 97% 96% 99%  Weight:      Height:       Physical Exam Vitals and nursing note reviewed.  Constitutional:      Comments: Chronically ill-appearing patient.  Attempts to withdraw from painful stimuli.  Does not respond to verbal stimuli. (Had received a dose of Ativan prior to my examination for agitation) BiPAP mask in place  HENT:     Head: Normocephalic and atraumatic.     Nose: Nose normal.     Mouth/Throat:     Mouth: Mucous membranes are dry.  Eyes:     Pupils: Pupils are equal, round, and reactive to light.  Cardiovascular:     Rate and Rhythm: Tachycardia present.  Pulmonary:     Breath sounds: Rales present.     Comments: Crackles at the bases bilaterally.  Scattered expiratory wheezes Abdominal:     General: Abdomen is flat. Bowel sounds are normal.     Palpations: Abdomen is soft.     Comments: Reducible inguinal hernia  Musculoskeletal:     Cervical back: Normal range of motion and neck supple.   Skin:    General: Skin is warm and dry.  Neurological:     Comments: Unable to assess  Psychiatric:     Comments: Unable to assess    Data Reviewed: Relevant notes from primary care and specialist visits, past discharge summaries as available in EHR, including Care Everywhere. Prior diagnostic testing as pertinent to current admission diagnoses Updated medications and problem lists for reconciliation ED course, including vitals, labs, imaging, treatment and response to treatment Triage notes, nursing and pharmacy notes and ED provider's notes Notable results as noted in HPI Labs reviewed.  VBG 7.11/53/48/16.8/71.9 White count 20.1, hemoglobin 16.6, hematocrit 52.5, RDW 12.5, platelet count 312, initial lactic acid greater than 9 >> 5.4, troponin 57 >> 31 sodium 142, potassium 3.2, chloride 104, bicarb 16, glucose 270, BUN 16, creatinine 1.38 compared to baseline of  0.71, calcium 9.7, total protein 7.6, albumin 3.4, AST 44, ALT 18, alkaline phosphatase 101 Chest x-ray reviewed by me shows atelectasis versus infiltrate at medial LEFT lower lobe. Remaining lungs clear. CT scan of the head without contrast shows atrophy with small vessel chronic ischemic changes of deep cerebral white matter. No acute intracranial abnormalities. CT scan of chest/abdomen/pelvis shows 7 mm nodule noted in left upper lobe. Non-contrast chest CT at 6-12 months is recommended. If the nodule is stable at time of repeat CT,then future CT at 18-24 months (from today's scan) is considered optional for low-risk patients, but is recommended for high-risk patients. Moderate size right inguinal hernia is noted which contains dense material, potentially may be dense fluid, but other pathology cannot be excluded. Ultrasound is recommended for further evaluation. Coronary artery calcifications are noted suggesting coronary artery disease.Moderate prostatic enlargement is noted. Aortic Atherosclerosis  and Emphysema  Twelve-lead  EKG reviewed by me shows sinus tachycardia with low voltage QRS There are no new results to review at this time.  Assessment and Plan: * Sepsis ([place]) Present on admission as evidenced by tachycardia, tachypnea, leukocytosis and marked lactic acidosis which shows a downward trend Source of sepsis at this time appears to be possible aspiration pneumonia given patient's mental status changes and history of dementia.  Imaging shows a left middle lobe infiltrate Patient received a dose of azithromycin, metronidazole and cefepime in the ER but will be placed on IV Unasyn Continue aggressive IV fluid resuscitation Trend lactic acid levels  Follow-up results of blood cultures   Acute respiratory failure with hypoxia (HCC) Most likely secondary to acute COPD exacerbation as well as presumed aspiration pneumonia. Patient required BiPAP upon presentation due to increased work of breathing, tachypnea and use of assessor muscles with room air pulse oximetry of 88%. We will attempt to wean off BiPAP once acute illness improves/resolves  COPD with acute exacerbation [facility]) Patient has an underlying history of COPD and was brought into the ER for evaluation of respiratory distress He initially required BiPAP for increased work of breathing, tachypnea and use of accessory muscles.   He was hypoxic with room air pulse oximetry of 88% We will attempt to wean off BiPAP as tolerated and place patient on oxygen supplementation to maintain pulse oximetry greater than 92% Continue as needed bronchodilator therapy Place patient on inhaled steroids Continue antibiotic therapy with Unasyn  AKI (acute kidney injury) ([place]) Unclear etiology Patient has a baseline serum creatinine of 0.71 and today on admission it is 1.38 Continue aggressive IV fluid resuscitation Repeat renal parameters in a.m. Avoid nephrotoxic agents  Acute metabolic encephalopathy Most likely secondary to sepsis Patient has a history of  dementia but according to family members is usually responsive and able to follow simple commands. Patient is currently unresponsive to verbal stimuli and tries to withdraw from painful stimuli Expect improvement in patient's mental status back to his baseline following resolution of acute illness  Dementia (HCC) Continue donepezil and sertraline once patient's mental status improves  Essential hypertension Blood pressure is stable Monitor closely during this hospitalization  Hypokalemia Supplement potassium Check magnesium levels      Advance Care Planning:   Code Status: DNR   Consults: None  Family Communication: 50% of time was spent discussing patient's condition and plan of care with his daughters at the bedside.  All questions and concerns have been addressed and they verbalized understanding and agree with the plan.  CODE STATUS was discussed and patient is a DO  NOT RESUSCITATE.   Severity of Illness: The appropriate patient status for this patient is INPATIENT. Inpatient status is judged to be reasonable and necessary in order to provide the required intensity of service to ensure the patient's safety. The patient's presenting symptoms, physical exam findings, and initial radiographic and laboratory data in the context of their chronic comorbidities is felt to place them at high risk for further clinical deterioration. Furthermore, it is not anticipated that the patient will be medically stable for discharge from the hospital within 2 midnights of admission.   * I certify that at the point of admission it is my clinical judgment that the patient will require inpatient hospital care spanning beyond 2 midnights from the point of admission due to high intensity of service, high risk for further deterioration and high frequency of surveillance required.*  Author: Collier Bullock, MD 09/11/2021 3:11 PM  For on call review [email or web address].

## 2021-09-11 NOTE — Assessment & Plan Note (Signed)
Supplement potassium Check magnesium levels 

## 2021-09-11 NOTE — Assessment & Plan Note (Addendum)
Secondary to COPD exacerbation and likely aspiration pneumonia.  Initially required BiPAP due to significant respiratory distress with tachypnea and increased work of breathing use of accessory muscles.  Initial pulse ox on room air was 88%. -- Supplemental O2 to maintain sats 88 to 93% wean as tolerated -- Given single dose 20 mg Lasix on 5/29 --Manage pneumonia and COPD as outlined  Resolved.  O2 sat stable on room air

## 2021-09-11 NOTE — ED Provider Notes (Incomplete Revision)
Consider  [facility] Provider Note    Event Date/Time   First MD Initiated Contact with Patient 09/11/21 [phone]     (approximate)   History   Respiratory Distress   HPI  [name] [name] is a 76 y.o. male  with pmh Dementia, hypertension, BPH, COPD, vitamin D deficiency, mood disorder, anxiety who presents with respiratory distress.  Patient is brought in from his facility.  He was found dyspneic today.  Unable to obtain any history from the patient due to altered mental status.  On review of prior notes looks like patient does have dementia at baseline and does not converse much.  Patient's daughter is at bedside says that typically he is able to talk somewhat and that his mental status currently is worse than normal.  Currently he is full code, but they are discussing goals of care.     Past Medical History:  Diagnosis Date   COPD (chronic obstructive pulmonary disease) (HCC)    Male erectile disorder     Patient Active Problem List   Diagnosis Date Noted   Sepsis (HCC) 02/08/2017   UTI (urinary tract infection) 02/08/2017   Alcoholism (HCC) 02/08/2017     Physical Exam  Triage Vital Signs: ED Triage Vitals  Enc Vitals Group     BP 09/11/21 0839 130/88     Pulse Rate 09/11/21 0839 (!) 140     Resp 09/11/21 0839 (!) 25     Temp --      Temp src --      SpO2 09/11/21 0839 (!) 88 %     Weight 09/11/21 0835 113 lb 1.5 oz (51.3 kg)     Height 09/11/21 0835  (1.676 m)     Head Circumference --      Peak Flow --      Pain Score 09/11/21 0835 0     Pain Loc --      Pain Edu? --      Excl. in GC? --     Most recent vital signs: Vitals:   09/11/21 1215 09/11/21 1245  BP: (!) 135/113 (!) 166/104  Pulse: (!) 57 (!) 108  Resp: (!) 24 (!) 28  Temp:    SpO2: 90% 100%     General: Awake, patient is thin and ill-appearing he is tachypneic CV:  No lower extremity edema, bilateral feet are cold, no palpable pulses, calves are  warm Resp:  Patient has increased work of breathing with accessory muscle use tachypnea and poor air movement with wheezing Abd:  Patient groans with palpation of the abdomen is not distended abdominal muscles are hard Neuro:             Patient opens his eyes to voice, does not follow commands, speech is incomprehensible Other:     ED Results / Procedures / Treatments  Labs (all labs ordered are listed, but only abnormal results are displayed) Labs Reviewed  BLOOD GAS, VENOUS - Abnormal; Notable for the following components:      Result Value   pH, Ven 7.11 (*)    pO2, Ven 48 (*)    Bicarbonate 16.8 (*)    Acid-base deficit 13.2 (*)    All other components within normal limits  LACTIC ACID, PLASMA - Abnormal; Notable for the following components:   Lactic Acid, Venous >9.0 (*)    All other components within normal limits  LACTIC ACID, PLASMA - Abnormal; Notable for the following components:   Lactic Acid, Venous  5.4 (*)    All other components within normal limits  COMPREHENSIVE METABOLIC PANEL - Abnormal; Notable for the following components:   Potassium 3.2 (*)    CO2 16 (*)    Glucose, Bld 270 (*)    Creatinine, Ser 1.38 (*)    Albumin 3.4 (*)    AST 44 (*)    GFR, Estimated 53 (*)    Anion gap 22 (*)    All other components within normal limits  CBC WITH DIFFERENTIAL/PLATELET - Abnormal; Notable for the following components:   WBC 20.1 (*)    RBC 5.87 (*)    HCT 52.5 (*)    Neutro Abs 15.2 (*)    Abs Immature Granulocytes 0.36 (*)    All other components within normal limits  PROTIME-INR - Abnormal; Notable for the following components:   Prothrombin Time 15.3 (*)    All other components within normal limits  APTT - Abnormal; Notable for the following components:   aPTT 39 (*)    All other components within normal limits  URINALYSIS, COMPLETE (UACMP) WITH MICROSCOPIC - Abnormal; Notable for the following components:   Color, Urine YELLOW (*)    APPearance CLOUDY  (*)    Glucose, UA 50 (*)    Protein, ur 100 (*)    Leukocytes,Ua TRACE (*)    Bacteria, UA RARE (*)    All other components within normal limits  TROPONIN I (HIGH SENSITIVITY) - Abnormal; Notable for the following components:   Troponin I (High Sensitivity) 57 (*)    All other components within normal limits  TROPONIN I (HIGH SENSITIVITY) - Abnormal; Notable for the following components:   Troponin I (High Sensitivity) 31 (*)    All other components within normal limits  CULTURE, BLOOD (ROUTINE X 2)  CULTURE, BLOOD (ROUTINE X 2)  URINE CULTURE  BRAIN NATRIURETIC PEPTIDE     EKG  EKG interpretation performed by myself somewhat limited secondary to baseline artifact however does show sinus tachycardia, question some ST elevation in inferior leads, left axis deviation   RADIOLOGY I reviewed and interpreted the chest x-ray which shows a questionable left middle lobe infiltrate   PROCEDURES:  Critical Care performed: Yes, see critical care procedure note(s)  .1-3 Lead EKG Interpretation Performed by: Georga Hacking, MD Authorized by: Georga Hacking, MD     Interpretation: abnormal     ECG rate assessment: tachycardic     Rhythm: sinus tachycardia     Ectopy: none     Conduction: normal   .Critical Care Performed by: Georga Hacking, MD Authorized by: Georga Hacking, MD   Critical care provider statement:    Critical care time (minutes):  75   Critical care was time spent personally by me on the following activities:  Development of treatment plan with patient or surrogate, discussions with consultants, evaluation of patient's response to treatment, examination of patient, ordering and review of laboratory studies, ordering and review of radiographic studies, ordering and performing treatments and interventions, pulse oximetry, re-evaluation of patient's condition and review of old charts  The patient is on the cardiac monitor to evaluate for evidence of  arrhythmia and/or significant heart rate changes.   MEDICATIONS ORDERED IN ED: Medications  lactated ringers infusion ( Intravenous New Bag/Given 09/11/21 1200)  metroNIDAZOLE (FLAGYL) IVPB 500 mg (500 mg Intravenous New Bag/Given 09/11/21 1209)  ipratropium-albuterol (DUONEB) 0.5-2.5 (3) MG/3ML nebulizer solution 3 mL (3 mLs Nebulization Given 09/11/21 0858)  ipratropium-albuterol (DUONEB) 0.5-2.5 (3) MG/3ML  nebulizer solution 3 mL (3 mLs Nebulization Given 09/11/21 0858)  ipratropium-albuterol (DUONEB) 0.5-2.5 (3) MG/3ML nebulizer solution 3 mL (3 mLs Nebulization Given 09/11/21 0916)  lactated ringers bolus 1,000 mL (0 mLs Intravenous Stopped 09/11/21 1055)  vancomycin (VANCOCIN) IVPB 1000 mg/200 mL premix (0 mg Intravenous Stopped 09/11/21 1156)  ceFEPIme (MAXIPIME) 2 g in sodium chloride 0.9 % 100 mL IVPB (0 g Intravenous Stopped 09/11/21 0936)  azithromycin (ZITHROMAX) 500 mg in sodium chloride 0.9 % 250 mL IVPB (0 mg Intravenous Stopped 09/11/21 1055)  lactated ringers bolus 1,000 mL (1,000 mLs Intravenous New Bag/Given 09/11/21 1055)     IMPRESSION / MDM / ASSESSMENT AND PLAN / ED COURSE  I reviewed the triage vital signs and the nursing notes.                              Differential diagnosis includes, but is not limited to, COPD exacerbation, pneumonia, PE, metabolic acidosis  The patient is a 76 year old male who is chronically ill who presents in respiratory distress.  He is ill-appearing on arrival on a nonrebreather difficult to obtain sats.  Extremities are cold.  He opens his eyes to voice has some incomprehensible speech and does not follow commands.  He has increased work of breathing so was placed on BiPAP to temporize.  His abdomen is not obviously rigid but he does appear to grimace when I palpate on it.  No signs of skin or soft tissue infection.  Chest x-ray read by radiology as a possible left lower lobe infiltrate.  Patient's labs are concerning he has a metabolic acidosis  that is not properly compensated for with a pH of 711 PCO2 of 53 he has a leukocytosis to almost 20 and a lactate over 9.  He is also doubled his creatinine to 1.4.  We will obtain a CT head chest abdomen pelvis to further delineate any pneumonia versus other infectious source.  Did give DuoNebs cefepime Vanco azithromycin to cover for pneumonia.  2 daughters are at bedside.  I had a long discussion with them about his critical illness and likelihood to need to make a decision about intubation versus not.  Currently he is holding his own on BiPAP but his mental status is not great and he is at risk for aspiration.  They are thinking this over.  I am concerned that with his general unwell health that he will be difficult to wean off the ventilator and this is probably not the best decision for him.  Continue to treat with antibiotics fluids and vasopressors as needed in the meantime.  CT abdomen pelvis does not show any acute pathology.  There is a right inguinal hernia with dense fluid.  On bedside exam this is reducible.  Patient does appear somewhat uncomfortable not clear exactly what is causing this.  Other causes a severe lactic acidosis such as mesenteric ischemia but there are no secondary signs on CT abdomen patient not clearly an abdominal pain.  Is not on metformin.  Pressure is okay but he is poorly perfused.  Looked back at most recent palliative care visit and his blood pressure is about what it is today 130/80.  We will add on Flagyl for full coverage, as CT of the chest did not show an obvious pneumonia.  Consider meningitis/encephalitis since still certainly possible.  May need to consider LP as an inpatient if no other source.  Blood cultures are pending.  UA  also is in process.  UA has some WBCs.  He is being covered for potential UTI urine culture sent.  Patient's 3 daughters are at bedside.  Had a long discussion with them about the gravity of his condition.  They ultimately agreed that he  will be DNR/DNI but they would like all additional measures to be taken.  At this time with the acidosis think that he is best to be kept on BiPAP.  Will discuss with hospitalist for admission.      FINAL CLINICAL IMPRESSION(S) / ED DIAGNOSES   Final diagnoses:  Metabolic acidosis  Sepsis, due to unspecified organism, unspecified whether acute organ dysfunction present [facility](HCC)     Rx / DC Orders   ED Discharge Orders     None        Note:  This document was prepared using Dragon voice recognition software and may include unintentional dictation errors.   Georga HackingMcHugh, Lendora Keys Rose, MD 09/11/21 [phone]

## 2021-09-11 NOTE — ED Notes (Signed)
Pt is not staying awake enough to give PO medication. This RN reached out to Agabata, MD about concerns. MD states to hold all PO medication.

## 2021-09-11 NOTE — Assessment & Plan Note (Addendum)
Presented in respiratory distress requiring BiPAP for increased work of breathing, tachypnea and use of accessory muscles.   He was hypoxic with room air pulse oximetry of 88%, now stable on room air -- Taper prednisone -- Continue bronchodilators, iInhaled steroid --Completed 5-day Unasyn for probable aspiration pneumonia

## 2021-09-11 NOTE — ED Notes (Signed)
Daughter at bedside. Daughter has not seen patient since January 1st.

## 2021-09-11 NOTE — ED Triage Notes (Signed)
BIB EMS from [facility]. Called for resp distress. Decreased LOC. Unknown baseline by staff. Pt has audible crackle.  140 HR  RR decreased and shallow 80/systolic BP 252 BGL CO2 23

## 2021-09-11 NOTE — Progress Notes (Signed)
PHARMACY -  BRIEF ANTIBIOTIC NOTE   Pharmacy has received consult(s) for vancomycin and cfepime from an ED provider.  The patient's profile has been reviewed for ht/wt/allergies/indication/available labs.    One time order(s) placed by MD for Vancomycin 1 gm and Cefepime 2gm  Further antibiotics/pharmacy consults should be ordered by admitting physician if indicated.                       Thank you, Tanai Bouler A 09/11/2021  9:41 AM

## 2021-09-11 NOTE — Progress Notes (Signed)
Initial Nutrition Assessment  DOCUMENTATION CODES:   Underweight  INTERVENTION:   -RD will follow for diet advancement and add supplements as appropriate  NUTRITION DIAGNOSIS:   Increased nutrient needs related to chronic illness (COPD) as evidenced by estimated needs.  GOAL:   Patient will meet greater than or equal to 90% of their needs  MONITOR:   PO intake, Supplement acceptance, Diet advancement  REASON FOR ASSESSMENT:   Consult Assessment of nutrition requirement/status  ASSESSMENT:   Pt with medical history significant for dementia, hypertension and COPD who was brought in for evaluation of decreased responsiveness and respiratory distress.  Pt admitted with sepsis and COPD exacerbation.   Reviewed I/O's: +2.2 L x 24 hours  UOP: 16 ml x 24 hours  Pt unavailable at time of visit. RD unable to obtain further nutrition-related history or complete nutrition-focused physical exam at this time.    Pt has transitioned off bi-pap. Pt is a resident of [facility]. He is currently NPO and not responsive to verbal stimuli, but tries to withdraw from painful stimuli.   Suspect poor oral intake PTA given underweight status. Pt is at high risk for malnutrition, however, unable to identify at this time.   Reviewed wt hx; wt has been stable over the past 3 months.   Medications reviewed and include senokot and lactated ringers infusion @ 125 ml/hr.   Labs reviewed: K: 3.2.    Diet Order:   Diet Order             Diet NPO time specified  Diet effective now                   EDUCATION NEEDS:   No education needs have been identified at this time  Skin:  Skin Assessment: Reviewed RN Assessment  Last BM:  Unknown  Height:   Ht Readings from Last 1 Encounters:  09/11/21 5\' 6"  (1.676 m)    Weight:   Wt Readings from Last 1 Encounters:  09/11/21 51.3 kg    Ideal Body Weight:  64.5 kg  BMI:  Body mass index is 18.25 kg/m.  Estimated  Nutritional Needs:   Kcal:  1600-1800  Protein:  85-100 grams  Fluid:  > 1.6 L    09/13/21, RD, LDN, CDCES Registered Dietitian II Certified Diabetes Care and Education Specialist Please refer to [facility] for RD and/or RD on-call/weekend/after hours pager

## 2021-09-11 NOTE — Sepsis Progress Note (Signed)
eLink is following this Code Sepsis. °

## 2021-09-11 NOTE — Progress Notes (Signed)
CODE SEPSIS - PHARMACY COMMUNICATION  **Broad Spectrum Antibiotics should be administered within 1 hour of Sepsis diagnosis**  Time Code Sepsis Called/Page Received: 0916  Antibiotics Ordered: Cefepime, vanc, azith  Time of 1st antibiotic administration: 0926  Additional action taken by pharmacy:    If necessary, Name of Provider/Nurse Contacted:      Angelique Blonder ,PharmD Clinical Pharmacist  09/11/2021  9:43 AM

## 2021-09-11 NOTE — ED Notes (Signed)
MD requested to stop BIPAP and placed on [place].

## 2021-09-11 NOTE — Progress Notes (Signed)
Pharmacy Antibiotic Note  [name] [name] is a 76 y.o. male w/ PMH of HTN, dementia, BPH, COPD admitted on 09/11/2021 with  respiratory distress.  Pharmacy has been consulted for Unasyn dosing.  Plan: start Unasyn 3 grams IV every 6 hours  Height: 5\' 6"  (167.6 cm) Weight: 51.3 kg (113 lb 1.5 oz) IBW/kg (Calculated) : 63.8  Temp (24hrs), Avg:98.5 F (36.9 C), Min:98.5 F (36.9 C), Max:98.5 F (36.9 C)  Recent Labs  Lab 09/11/21 0836 09/11/21 1051  WBC 20.1*  --   CREATININE 1.38*  --   LATICACIDVEN >9.0* 5.4*    Estimated Creatinine Clearance: 33.6 mL/min (A) (by C-G formula based on SCr of 1.38 mg/dL (H)).    No Known Allergies  Antimicrobials this admission: 05/26 vancomycin, cefepime, metronidazole, azithromycin x 1 05/26 Unasyn >>   Microbiology results: 05/26 BCx: pending 05/26 UCx: pending   Thank you for allowing pharmacy to be a part of this patient's care.  6/26 09/11/2021 3:03 PM

## 2021-09-11 NOTE — ED Notes (Signed)
Family to nurses station. Pt BP is elevated and he seems very restless. MD made aware.

## 2021-09-11 NOTE — Progress Notes (Signed)
Patient noted to be off bipap on nasal cannula in no distress. Bipap on standby

## 2021-09-11 NOTE — Assessment & Plan Note (Signed)
Superimposed on baseline dementia in the setting of infection.  Mental status appears back to baseline.  Due to dementia, baseline confusion is chronic. --Delirium precautions

## 2021-09-11 NOTE — Assessment & Plan Note (Signed)
Blood pressure is stable Monitor closely during this hospitalization

## 2021-09-11 NOTE — Assessment & Plan Note (Signed)
Unclear etiology Patient has a baseline serum creatinine of 0.71 and today on admission it is 1.38 Continue aggressive IV fluid resuscitation Repeat renal parameters in a.m. Avoid nephrotoxic agents

## 2021-09-11 NOTE — Assessment & Plan Note (Signed)
Continue donepezil and sertraline once patient's mental status improves

## 2021-09-12 DIAGNOSIS — J441 Chronic obstructive pulmonary disease with (acute) exacerbation: Secondary | ICD-10-CM

## 2021-09-12 DIAGNOSIS — N179 Acute kidney failure, unspecified: Secondary | ICD-10-CM | POA: Diagnosis not present

## 2021-09-12 DIAGNOSIS — G9341 Metabolic encephalopathy: Secondary | ICD-10-CM | POA: Diagnosis not present

## 2021-09-12 DIAGNOSIS — A419 Sepsis, unspecified organism: Secondary | ICD-10-CM | POA: Diagnosis not present

## 2021-09-12 DIAGNOSIS — J9601 Acute respiratory failure with hypoxia: Secondary | ICD-10-CM | POA: Diagnosis not present

## 2021-09-12 LAB — BASIC METABOLIC PANEL
Anion gap: 9 (ref 5–15)
BUN: 10 mg/dL (ref 8–23)
CO2: 28 mmol/L (ref 22–32)
Calcium: 9.5 mg/dL (ref 8.9–10.3)
Chloride: 107 mmol/L (ref 98–111)
Creatinine, Ser: 0.88 mg/dL (ref 0.61–1.24)
GFR, Estimated: >60 mL/min (ref >60)
Glucose, Bld: 100 mg/dL — ABNORMAL HIGH (ref 70–99)
Potassium: 4.6 mmol/L (ref 3.5–5.1)
Sodium: 144 mmol/L (ref 135–145)

## 2021-09-12 LAB — CBC
HCT: 45.3 % (ref 39.0–52.0)
Hemoglobin: 14.6 g/dL (ref 13.0–17.0)
MCH: 28 pg (ref 26.0–34.0)
MCHC: 32.2 g/dL (ref 30.0–36.0)
MCV: 86.9 fL (ref 80.0–100.0)
Platelets: 208 10*3/uL (ref 150–400)
RBC: 5.21 MIL/uL (ref 4.22–5.81)
RDW: 12.7 % (ref 11.5–15.5)
WBC: 11.2 10*3/uL — ABNORMAL HIGH (ref 4.0–10.5)
nRBC: 0 % (ref 0.0–0.2)

## 2021-09-12 LAB — LACTIC ACID, PLASMA
Lactic Acid, Venous: 1.4 mmol/L (ref 0.5–1.9)
Lactic Acid, Venous: 1.2 mmol/L (ref 0.5–1.9)

## 2021-09-12 LAB — PROTIME-INR
INR: 1.1 (ref 0.8–1.2)
Prothrombin Time: 14.4 seconds (ref 11.4–15.2)

## 2021-09-12 LAB — PROCALCITONIN: Procalcitonin: 1.74 ng/mL

## 2021-09-12 LAB — CORTISOL-AM, BLOOD: Cortisol - AM: 23.8 ug/dL — ABNORMAL HIGH (ref 6.7–22.6)

## 2021-09-12 LAB — HIV ANTIBODY (ROUTINE TESTING W REFLEX): HIV Screen 4th Generation wRfx: NONREACTIVE

## 2021-09-12 MED ORDER — METHYLPREDNISOLONE SODIUM SUCC 125 MG IJ SOLR
125.0000 mg | INTRAMUSCULAR | Status: DC
  Administered 2021-09-12 – 2021-09-13 (×2): 125 mg via INTRAVENOUS
  Filled 2021-09-12 (×2): qty 2

## 2021-09-12 MED ORDER — BUDESONIDE 0.5 MG/2ML IN SUSP
0.5000 mg | Freq: Two times a day (BID) | RESPIRATORY_TRACT | Status: DC
  Administered 2021-09-12 – 2021-09-13 (×4): 0.5 mg via RESPIRATORY_TRACT
  Filled 2021-09-12 (×3): qty 2

## 2021-09-12 NOTE — Progress Notes (Signed)
PT Cancellation Note  Patient Details Name: [name] [name] MRN: [id number] DOB: [birth year]   Cancelled Treatment:    Reason Eval/Treat Not Completed: Patient not medically ready PT orders received, chart reviewed. Pt noted to have elevated HR & BP at rest & not medically appropriate for PT intervention at this time. Will f/u as able & as pt is appropriate.  Aleda Grana, PT, DPT 09/12/21, 8:02 AM  Sandi Mariscal 09/12/2021, 8:02 AM

## 2021-09-12 NOTE — Progress Notes (Signed)
PROGRESS NOTE    [name] [name]  [id number] DOB: 09/19/1945 DOA: 09/11/2021 PCP: Perrin Maltese, MD    Brief Narrative:  [name] [name] is a 76 y.o. male with medical history significant for dementia, hypertension and COPD who was brought into the ER by EMS from [place] for evaluation of decreased responsiveness and respiratory distress. Per EMS he was tachycardic with heart rate of 140, hypotensive with systolic blood pressure in the 80s and blood sugar was 252.  5/27 this a.m. short of breath with forced expiratory wheezing.  Nebs were given.  On facemask.  Receiving nebs  Consultants:    Procedures:   Antimicrobials:  Unasyn   Subjective: Has facemask on.  With expiratory wheezing.  Not really communicating.  Objective: Vitals:   09/12/21 0849 09/12/21 0900 09/12/21 1000 09/12/21 1100  BP:  (!) 133/97 102/82 (!) 131/95  Pulse:   (!) 110 (!) 108  Resp:  (!) 27 19 18   Temp:    97.9 F (36.6 C)  TempSrc:    Axillary  SpO2: 100%  96% 100%  Weight:      Height:        Intake/Output Summary (Last 24 hours) at 09/12/2021 1216 Last data filed at 09/12/2021 1000 Gross per 24 hour  Intake 2851.19 ml  Output 1600 ml  Net 1251.19 ml   Filed Weights   09/11/21 0835 09/12/21 0650  Weight: 51.3 kg 49.7 kg    Examination: Mildly sob, has fm on  Mildly b/l rhonchi with forced expiratory wheezing Reg mildly tachycardic s1/s2 no gallop Soft benign +bs No edema Awake Mood and affect appropriate in current setting     Data Reviewed: I have personally reviewed following labs and imaging studies  CBC: Recent Labs  Lab 09/11/21 0836 09/12/21 0448  WBC 20.1* 11.2*  NEUTROABS 15.2*  --   HGB 16.6 14.6  HCT 52.5* 45.3  MCV 89.4 86.9  PLT 312 [id number]   Basic Metabolic Panel: Recent Labs  Lab 09/11/21 0836 09/11/21 1051 09/12/21 0448  NA 142  --  144  K 3.2*  --  4.6  CL 104  --  107  CO2 16*  --  28  GLUCOSE 270*  --  100*  BUN 16  --  10   CREATININE 1.38*  --  0.88  CALCIUM 9.7  --  9.5  MG  --  2.4  --    GFR: Estimated Creatinine Clearance: 51 mL/min (by C-G formula based on SCr of 0.88 mg/dL). Liver Function Tests: Recent Labs  Lab 09/11/21 0836  AST 44*  ALT 18  ALKPHOS 101  BILITOT 0.6  PROT 7.6  ALBUMIN 3.4*   No results for input(s): LIPASE, AMYLASE in the last 168 hours. No results for input(s): AMMONIA in the last 168 hours. Coagulation Profile: Recent Labs  Lab 09/11/21 0836 09/12/21 0448  INR 1.2 1.1   Cardiac Enzymes: No results for input(s): CKTOTAL, CKMB, CKMBINDEX, TROPONINI in the last 168 hours. BNP (last 3 results) No results for input(s): PROBNP in the last 8760 hours. HbA1C: No results for input(s): HGBA1C in the last 72 hours. CBG: No results for input(s): GLUCAP in the last 168 hours. Lipid Profile: No results for input(s): CHOL, HDL, LDLCALC, TRIG, CHOLHDL, LDLDIRECT in the last 72 hours. Thyroid Function Tests: No results for input(s): TSH, T4TOTAL, FREET4, T3FREE, THYROIDAB in the last 72 hours. Anemia Panel: No results for input(s): VITAMINB12, FOLATE, FERRITIN, TIBC, IRON, RETICCTPCT in the last 72 hours. Sepsis  Labs: Recent Labs  Lab 09/11/21 0836 09/11/21 1051 09/12/21 0448 09/12/21 0844 09/12/21 1114  PROCALCITON  --   --  1.74  --   --   LATICACIDVEN >9.0* 5.4*  --  1.4 1.2    Recent Results (from the past 240 hour(s))  Blood Culture (routine x 2)     Status: None (Preliminary result)   Collection Time: 09/11/21  8:55 AM   Specimen: BLOOD  Result Value Ref Range Status   Specimen Description BLOOD BLOOD RIGHT FOREARM  Final   Special Requests   Final    BOTTLES DRAWN AEROBIC AND ANAEROBIC Blood Culture results may not be optimal due to an inadequate volume of blood received in culture bottles   Culture   Final    NO GROWTH < 24 HOURS Performed at [facility], [address], [place], [place] [address]    Report Status PENDING  Incomplete          Radiology Studies: CT HEAD WO CONTRAST (5MM)  Result Date: 09/11/2021 CLINICAL DATA:  Mental status changes EXAM: CT HEAD WITHOUT CONTRAST TECHNIQUE: Contiguous axial images were obtained from the base of the skull through the vertex without intravenous contrast. RADIATION DOSE REDUCTION: This exam was performed according to the departmental dose-optimization program which includes automated exposure control, adjustment of the mA and/or kV according to patient size and/or use of iterative reconstruction technique. COMPARISON:  04/09/2020 FINDINGS: Brain: Generalized atrophy. Normal ventricular morphology. No midline shift or mass effect. Small vessel chronic ischemic changes of deep cerebral white matter. No intracranial hemorrhage, mass lesion, evidence of acute infarction, or extra-axial fluid collection. Vascular: Mild atherosclerotic calcification of internal carotid arteries at skull base. No hyperdense vessels. Skull: Intact Sinuses/Orbits: Clear.  Minimal chronic LEFT mastoid fluid noted. Other: N/A IMPRESSION: Atrophy with small vessel chronic ischemic changes of deep cerebral white matter. No acute intracranial abnormalities. Electronically Signed   By: Lavonia Dana M.D.   On: 09/11/2021 10:33   DG Chest Port 1 View  Result Date: 09/11/2021 CLINICAL DATA:  Questionable sepsis, COPD, former smoker EXAM: PORTABLE CHEST 1 VIEW COMPARISON:  Portable exam 0848 hours compared to 06/13/2017 FINDINGS: Normal heart size, mediastinal contours, and pulmonary vascularity. Retrocardiac opacity at medial LEFT lung base, question atelectasis or infiltrate. Remaining lungs clear. No additional infiltrate, pleural effusion, or pneumothorax. Mild gaseous distention of stomach. IMPRESSION: Atelectasis versus infiltrate at medial LEFT lower lobe. Remaining lungs clear. Electronically Signed   By: Lavonia Dana M.D.   On: 09/11/2021 09:06   CT CHEST ABDOMEN PELVIS WO CONTRAST  Result Date:  09/11/2021 CLINICAL DATA:  Sepsis. EXAM: CT CHEST, ABDOMEN AND PELVIS WITHOUT CONTRAST TECHNIQUE: Multidetector CT imaging of the chest, abdomen and pelvis was performed following the standard protocol without IV contrast. RADIATION DOSE REDUCTION: This exam was performed according to the departmental dose-optimization program which includes automated exposure control, adjustment of the mA and/or kV according to patient size and/or use of iterative reconstruction technique. COMPARISON:  June 07, 2008. FINDINGS: CT CHEST FINDINGS Cardiovascular: No evidence of thoracic aortic aneurysm. Normal cardiac size. No pericardial effusion. Coronary artery calcifications are noted. Mediastinum/Nodes: No enlarged mediastinal, hilar, or axillary lymph nodes. Thyroid gland, trachea, and esophagus demonstrate no significant findings. Lungs/Pleura: No pneumothorax or pleural effusion is noted. Hyperexpansion of the lungs is noted. Minimal bibasilar scarring is noted emphysematous disease is noted. 7 mm nodule is noted in perihilar region of left upper lobe best seen on image number 94 series 4. Musculoskeletal: No  chest wall mass or suspicious bone lesions identified. CT ABDOMEN PELVIS FINDINGS Hepatobiliary: No focal liver abnormality is seen. No gallstones, gallbladder wall thickening, or biliary dilatation. Pancreas: Unremarkable. No pancreatic ductal dilatation or surrounding inflammatory changes. Spleen: Normal in size without focal abnormality. Adrenals/Urinary Tract: Adrenal glands appear normal. 6.2 cm right renal cyst is noted for which no further follow-up is required. No hydronephrosis or renal obstruction is noted. Urinary bladder is unremarkable. Stomach/Bowel: Stomach is unremarkable. There is no evidence of bowel obstruction or inflammation. Vascular/Lymphatic: Aortic atherosclerosis. No enlarged abdominal or pelvic lymph nodes. Reproductive: Moderate prostatic enlargement is noted. Other: No ascites is noted.  Moderate size right inguinal hernia is noted which appears to contain dense material. Musculoskeletal: No acute or significant osseous findings. IMPRESSION: 7 mm nodule noted in left upper lobe. Non-contrast chest CT at 6-12 months is recommended. If the nodule is stable at time of repeat CT, then future CT at 18-24 months (from today's scan) is considered optional for low-risk patients, but is recommended for high-risk patients. This recommendation follows the consensus statement: Guidelines for Management of Incidental Pulmonary Nodules Detected on CT Images: From the Fleischner Society 2017; Radiology 2017; 284:228-243. Moderate size right inguinal hernia is noted which contains dense material, potentially may be dense fluid, but other pathology cannot be excluded. Ultrasound is recommended for further evaluation. Coronary artery calcifications are noted suggesting coronary artery disease. Moderate prostatic enlargement is noted. Aortic Atherosclerosis (ICD10-I70.0) and Emphysema (ICD10-J43.9). Electronically Signed   By: Marijo Conception M.D.   On: 09/11/2021 10:45        Scheduled Meds:  budesonide (PULMICORT) nebulizer solution  0.5 mg Nebulization Q12H   donepezil  10 mg Oral Daily   enoxaparin (LOVENOX) injection  40 mg Subcutaneous Q24H   methylPREDNISolone (SOLU-MEDROL) injection  125 mg Intravenous Q24H   pantoprazole  40 mg Oral Daily   senna  1 tablet Oral Daily   sertraline  50 mg Oral Daily   tamsulosin  0.4 mg Oral Daily   Continuous Infusions:  ampicillin-sulbactam (UNASYN) IV 3 g (09/12/21 0658)   lactated ringers with kcl 75 mL/hr at 09/12/21 0859    Assessment & Plan:   Principal Problem:   Sepsis ([place]) Active Problems:   COPD with acute exacerbation ([place])   Acute respiratory failure with hypoxia ([place])   Acute metabolic encephalopathy   AKI (acute kidney injury) ([place])   Dementia ([place])   Essential hypertension   Hypokalemia   Sepsis ([place]) Present on admission as  evidenced by tachycardia, tachypnea, leukocytosis and marked lactic acidosis which shows a downward trend Source of sepsis at this time appears to be possible aspiration pneumonia given patient's mental status changes and history of dementia.  5/27 mildly tachycardic blood pressure stable Follow-up blood cultures Leukocytosis improving Continue IV Unasyn   Aspiration Pneumonia Continue on IV Unasyn SPL to evaluate patient Currently n.p.o. Continue IV fluids   Acute respiratory failure with hypoxia ([facility]) Most likely secondary to acute COPD exacerbation as well as presumed aspiration pneumonia. Patient required BiPAP upon presentation due to increased work of breathing, tachypnea and use of assessor muscles with room air pulse oximetry of 88%. 5/27 weaned down to 2 L Has expiratory wheezing on exam We will start patient on IV Solu-Medrol Continue IV antibiotics as above   COPD with acute exacerbation [facility]) Patient has an underlying history of COPD and was brought into the ER for evaluation of respiratory distress He initially required BiPAP for increased work of breathing,  tachypnea and use of accessory muscles.   He was hypoxic with room air pulse oximetry of 88% 5/27 weaned down to 2 L Continue DuoNebs  Started on IV steroids as above   AKI (acute kidney injury) ([place]) Likely prerenal from sepsis Improved with IV fluids Decrease rate to 75 MLS per hour     Acute metabolic encephalopathy Most likely secondary to sepsis Patient has a history of dementia but according to family members is usually responsive and able to follow simple commands. 5/27 awake this am , daughter stated recognized them. At baseline at times he is confused but interactive   Dementia ([place]) Continue donepezil and sertraline once patient's mental status improves   Essential hypertension Stable   Hypokalemia Supplemented and stable   Lactic acidosis Likely due to sepsis and acute respiratory  failure Improved with IV fluids and treatment above   DVT prophylaxis: Lovenox Code Status: DNR Family Communication: Daughter updated  Disposition Plan:  Status is: Inpatient Remains inpatient appropriate because: IV treatment, still very dyspneic        LOS: 1 day   Time spent: 55 minutes with more than 50% on [place], MD [facility] Pager [phone]  If 7PM-7AM, please contact night-coverage 09/12/2021, 12:16 PM

## 2021-09-12 NOTE — Progress Notes (Signed)
OT Cancellation Note  Patient Details Name: [name] [name] MRN: [id number] DOB: [birth year]   Cancelled Treatment:    Reason Eval/Treat Not Completed: Other (comment) (orders recieved, chart reviewed. Pt with elevated BP, HR, RR at this time. Not approrpiate for OT evaluation. OT will reattempt when pt is medically ready.)  Oleta Mouse, OTD OTR/L  09/12/21, 9:11 AM

## 2021-09-12 NOTE — Progress Notes (Signed)
SLP Cancellation Note  Patient Details Name: [name] [name] MRN: [id number] DOB: 10-Dec-1945   Cancelled treatment:       Reason Eval/Treat Not Completed: Medical issues which prohibited therapy;Patient not medically ready (chart reviewed; consulted RT) Pt noted to have elevated HR & tachypnea at rest & not medically appropriate for BSE. Pt is at high risk for aspiration w/ any po intake currently. Recommend continue NPO status w/ oral care. ST services will f/u tomorrow. NSG updated.     Orinda Kenner, MS, CCC-SLP Speech Language Pathologist Rehab Services; [place] [phone] (ascom) Moriya Mitchell 09/12/2021, 9:10 AM

## 2021-09-13 DIAGNOSIS — N179 Acute kidney failure, unspecified: Secondary | ICD-10-CM | POA: Diagnosis not present

## 2021-09-13 DIAGNOSIS — J9601 Acute respiratory failure with hypoxia: Secondary | ICD-10-CM | POA: Diagnosis not present

## 2021-09-13 DIAGNOSIS — G9341 Metabolic encephalopathy: Secondary | ICD-10-CM | POA: Diagnosis not present

## 2021-09-13 DIAGNOSIS — A419 Sepsis, unspecified organism: Secondary | ICD-10-CM | POA: Diagnosis not present

## 2021-09-13 MED ORDER — CHLORHEXIDINE GLUCONATE 0.12 % MT SOLN
15.0000 mL | Freq: Two times a day (BID) | OROMUCOSAL | Status: DC
  Administered 2021-09-13 – 2021-09-18 (×10): 15 mL via OROMUCOSAL
  Filled 2021-09-13 (×10): qty 15

## 2021-09-13 MED ORDER — ORAL CARE MOUTH RINSE
15.0000 mL | Freq: Two times a day (BID) | OROMUCOSAL | Status: DC
  Administered 2021-09-14 – 2021-09-18 (×4): 15 mL via OROMUCOSAL

## 2021-09-13 MED ORDER — METHYLPREDNISOLONE SODIUM SUCC 125 MG IJ SOLR
80.0000 mg | INTRAMUSCULAR | Status: DC
  Administered 2021-09-14: 80 mg via INTRAVENOUS
  Filled 2021-09-13: qty 2

## 2021-09-13 MED ORDER — HYDRALAZINE HCL 20 MG/ML IJ SOLN
10.0000 mg | Freq: Four times a day (QID) | INTRAMUSCULAR | Status: DC | PRN
  Administered 2021-09-14: 10 mg via INTRAVENOUS
  Filled 2021-09-13: qty 1

## 2021-09-13 NOTE — Progress Notes (Signed)
OT Cancellation Note  Patient Details Name: [name] [name] MRN: [id number] DOB: 12/27/1945   Cancelled Treatment:    Reason Eval/Treat Not Completed: OT screened, no needs identified, will sign off. Per chart review and pallative note, pt is a long term resident and is currently total care for all aspects of care and needs assistance for self feeding. Pt does not need skilled OT intervention at this time. OT to complete order.   Darleen Crocker, [place], OTR/L , CBIS ascom [phone]  09/13/21, 7:49 AM

## 2021-09-13 NOTE — Evaluation (Signed)
Clinical/Bedside Swallow Evaluation Patient Details  Name: [name] [name] MRN: [id number] Date of Birth: 1945/08/17  Today's Date: 09/13/2021 Time: SLP Start Time (ACUTE ONLY): 47 SLP Stop Time (ACUTE ONLY): 0925 SLP Time Calculation (min) (ACUTE ONLY): 10 min  Past Medical History:  Past Medical History:  Diagnosis Date   COPD (chronic obstructive pulmonary disease) ([place])    Male erectile disorder    Past Surgical History:  Past Surgical History:  Procedure Laterality Date   KNEE SURGERY Right    HPI:  Per H&P "[name] [name] is a 76 y.o. male with medical history significant for dementia, hypertension and COPD who was brought into the ER by EMS from [place] for evaluation of decreased responsiveness and respiratory distress.  Per EMS he was tachycardic with heart rate of 140, hypotensive with systolic blood pressure in the 80s and blood sugar was 252.  Patient's daughter states that she was called on the morning of his admission by the nursing home staff to inform her that there was sending him to the ER for evaluation of respiratory distress.  Daughter states that he has a known history of COPD but does not wear oxygen.  They have not seen him recently due to [place] concerns but have FaceTimed with him.  Initial blood gas in the ER showed uncompensated respiratory acidosis and patient was placed on a BiPAP.  Labs also showed marked leukocytosis, marked lactic acidosis concerning for possible sepsis of unclear etiology.  Patient received 2 L IV fluid bolus as well as empiric antibiotic therapy with cefepime and Zithromax.  He will be admitted to the hospital for further evaluation" CT Chest 09/11/21 "7 mm nodule noted in left upper lobe. Non-contrast chest CT at 6-12  months is recommended. If the nodule is stable at time of repeat CT,  then future CT at 18-24 months (from today's scan) is considered  optional for low-risk patients, but is recommended for high-risk  patients. This  recommendation follows the consensus statement:  Guidelines for Management of Incidental Pulmonary Nodules Detected  on CT Images: From the Fleischner Society 2017; Radiology 2017;  284:228-243.     Moderate size right inguinal hernia is noted which contains dense  material, potentially may be dense fluid, but other pathology cannot  be excluded. Ultrasound is recommended for further evaluation.     Coronary artery calcifications are noted suggesting coronary artery  disease.     Moderate prostatic enlargement is noted." CXR 09/11/21 "Atelectasis versus infiltrate at medial LEFT lower lobe.     Remaining lungs clear."   Assessment / Plan / Recommendation  Clinical Impression  Pt seen for clinical swallowing evaluation. Pt alert. Confusion evident. Nonsensical speech. On room air. Cleared with RN.  Pt unable to participate in full oral motor examination given inability to follow commands. Xerostomia and missing dentition appreciated.   Pt given trials of thin liquids (via straw sip), pureed, and solid. Pt with self-limiting behaviors and only accepted a few bites of puree and declined solid trials altogether despite education. Pt presents with s/sx mild-moderate oral dysphagia c/b intermittent oral holding of thin liquids and puree and delayed A-P transit with pureed. Pharyngeal phase appeared [facility] per clinical assessment. No overt or subtle s/sx pharyngeal dysphagia noted. No change to vocal quality appreciated. Seemingly adequate laryngeal elevation to palpation.   Recommend initiation of a pureed diet with thin liquids and safe swallowing strategies/aspiratoin precautions as outlined below. Suspect oral deficits exacerbated by AMS/cognitive deficits.   ?baseline swallowing status. No  family present to provide dysphagia hx.   Pt may benefit from RD consult given concerns for reduced oral intake PTA (BMI 17.68) and while on altered consistency diet as well as Palliative Consult for ongoing [place]  discussions given clinical presentation and hx of dementia.   Pt is at increased risk for aspiration/aspiration PNA given mental status/cognitive deficits, dependence for feeding, and multiple medical comorbidities.   SLP to f/u per POC for diet tolerance and trials of upgraded textures as appropriate.   RN made aware of results, recommendations, and SLP POC.   SLP Visit Diagnosis: Dysphagia, oral phase (R13.11)    Aspiration Risk  Mild aspiration risk;Risk for inadequate nutrition/hydration    Diet Recommendation Dysphagia 1 (Puree);Thin liquid   Medication Administration: Crushed with puree Supervision: Staff to assist with self feeding;Full supervision/cueing for compensatory strategies Compensations: Minimize environmental distractions;Slow rate;Small sips/bites Postural Changes: Seated upright at 90 degrees;Remain upright for at least 30 minutes after po intake    Other  Recommendations Recommended Consults:  (RD consult; Palliative Consult) Oral Care Recommendations: Oral care QID;Staff/trained caregiver to provide oral care    Recommendations for follow up therapy are one component of a multi-disciplinary discharge planning process, led by the attending physician.  Recommendations may be updated based on patient status, additional functional criteria and insurance authorization.  Follow up Recommendations Skilled nursing-short term rehab (<3 hours/day)      Assistance Recommended at Discharge Frequent or constant Supervision/Assistance  Functional Status Assessment Patient has had a recent decline in their functional status and demonstrates the ability to make significant improvements in function in a reasonable and predictable amount of time. (suspected; no family present to provide dysphagia hx)  Frequency and Duration min 2x/week  2 weeks       Prognosis Prognosis for Safe Diet Advancement: Fair Barriers to Reach Goals: Cognitive deficits;Behavior      Swallow Study    General Date of Onset: 09/11/21 HPI: Per H&P "[name] [name] is a 77 y.o. male with medical history significant for dementia, hypertension and COPD who was brought into the ER by EMS from [place] for evaluation of decreased responsiveness and respiratory distress.  Per EMS he was tachycardic with heart rate of 140, hypotensive with systolic blood pressure in the 80s and blood sugar was 252.  Patient's daughter states that she was called on the morning of his admission by the nursing home staff to inform her that there was sending him to the ER for evaluation of respiratory distress.  Daughter states that he has a known history of COPD but does not wear oxygen.  They have not seen him recently due to [place] concerns but have FaceTimed with him.  Initial blood gas in the ER showed uncompensated respiratory acidosis and patient was placed on a BiPAP.  Labs also showed marked leukocytosis, marked lactic acidosis concerning for possible sepsis of unclear etiology.  Patient received 2 L IV fluid bolus as well as empiric antibiotic therapy with cefepime and Zithromax.  He will be admitted to the hospital for further evaluation" Type of Study: Bedside Swallow Evaluation Previous Swallow Assessment: unknown Diet Prior to this Study:  (NPO) Temperature Spikes Noted: No Respiratory Status: Room air History of Recent Intubation: No Behavior/Cognition: Alert;Confused;Doesn't follow directions Oral Cavity Assessment: Dry Oral Care Completed by SLP: Recent completion by staff Oral Cavity - Dentition: Missing dentition Vision: Functional for self-feeding Self-Feeding Abilities: Needs assist Patient Positioning: Upright in bed Baseline Vocal Quality: Normal Volitional Cough: Cognitively unable to elicit  Volitional Swallow: Unable to elicit    Oral/Motor/Sensory Function Overall Oral Motor/Sensory Function:  (unable to assess)   Ice Chips Ice chips: Not tested   Thin Liquid Thin Liquid:  Impaired Oral Phase Impairments: Poor awareness of bolus Oral Phase Functional Implications: Oral holding Pharyngeal  Phase Impairments:  (appeared WFL) Other Comments: cues to swallow; ~3 oz single single straw sips    Nectar Thick Nectar Thick Liquid: Not tested   Honey Thick Honey Thick Liquid: Not tested   Puree Puree: Impaired Presentation: Spoon Oral Phase Impairments: Poor awareness of bolus Oral Phase Functional Implications: Prolonged oral transit;Oral holding Pharyngeal Phase Impairments:  (appeared WFL) Other Comments: x3 teaspoons; pt refused further trials   Solid     Solid: Not tested Other Comments: pt refused     Cherrie Gauze, M.S., [facility] [phone] [place])  Clearnce Sorrel Esther Bradstreet 09/13/2021,10:07 AM

## 2021-09-13 NOTE — Progress Notes (Signed)
PROGRESS NOTE    [name] [name]  TDD:[id number] DOB: March 10, 1946 DOA: 09/11/2021 PCP: Margaretann Loveless, MD    Brief Narrative:  [name] [name] is a 76 y.o. male with medical history significant for dementia, hypertension and COPD who was brought into the ER by EMS from [facility] for evaluation of decreased responsiveness and respiratory distress. Per EMS he was tachycardic with heart rate of 140, hypotensive with systolic blood pressure in the 80s and blood sugar was 252.  5/27 this a.m. short of breath with forced expiratory wheezing.  Nebs were given.  On facemask.  Receiving nebs 5/28 less tachypnea, off FM. Confused but pleasant  Consultants:    Procedures:   Antimicrobials:  Unasyn   Subjective: Denies having any complaints/issues/cp/or sob.  Objective: Vitals:   09/13/21 0002 09/13/21 0639 09/13/21 0818 09/13/21 1029  BP: 128/80 (!) 132/91  (!) 134/100  Pulse: 84 (!) 101  86  Resp: 12 12    Temp: 98.3 F (36.8 C) 98.2 F (36.8 C)  98 F (36.7 C)  TempSrc: Oral Oral    SpO2: 98% 96% 91% (!) 89%  Weight:      Height:        Intake/Output Summary (Last 24 hours) at 09/13/2021 1043 Last data filed at 09/13/2021 0001 Gross per 24 hour  Intake 799.27 ml  Output 650 ml  Net 149.27 ml   Filed Weights   09/11/21 0835 09/12/21 0650  Weight: 51.3 kg 49.7 kg    Examination: Calm, NAD Decrease bs, no wheezing.  Reg s1/s2 no gallop Soft benign +bs No edema Awake, confused Mood and affect appropriate in current setting     Data Reviewed: I have personally reviewed following labs and imaging studies  CBC: Recent Labs  Lab 09/11/21 0836 09/12/21 0448  WBC 20.1* 11.2*  NEUTROABS 15.2*  --   HGB 16.6 14.6  HCT 52.5* 45.3  MCV 89.4 86.9  PLT 312 208   Basic Metabolic Panel: Recent Labs  Lab 09/11/21 0836 09/11/21 1051 09/12/21 0448  NA 142  --  144  K 3.2*  --  4.6  CL 104  --  107  CO2 16*  --  28  GLUCOSE 270*  --  100*  BUN 16  --   10  CREATININE 1.38*  --  0.88  CALCIUM 9.7  --  9.5  MG  --  2.4  --    GFR: Estimated Creatinine Clearance: 51 mL/min (by C-G formula based on SCr of 0.88 mg/dL). Liver Function Tests: Recent Labs  Lab 09/11/21 0836  AST 44*  ALT 18  ALKPHOS 101  BILITOT 0.6  PROT 7.6  ALBUMIN 3.4*   No results for input(s): LIPASE, AMYLASE in the last 168 hours. No results for input(s): AMMONIA in the last 168 hours. Coagulation Profile: Recent Labs  Lab 09/11/21 0836 09/12/21 0448  INR 1.2 1.1   Cardiac Enzymes: No results for input(s): CKTOTAL, CKMB, CKMBINDEX, TROPONINI in the last 168 hours. BNP (last 3 results) No results for input(s): PROBNP in the last 8760 hours. HbA1C: No results for input(s): HGBA1C in the last 72 hours. CBG: No results for input(s): GLUCAP in the last 168 hours. Lipid Profile: No results for input(s): CHOL, HDL, LDLCALC, TRIG, CHOLHDL, LDLDIRECT in the last 72 hours. Thyroid Function Tests: No results for input(s): TSH, T4TOTAL, FREET4, T3FREE, THYROIDAB in the last 72 hours. Anemia Panel: No results for input(s): VITAMINB12, FOLATE, FERRITIN, TIBC, IRON, RETICCTPCT in the last 72 hours. Sepsis  Labs: Recent Labs  Lab 09/11/21 0836 09/11/21 1051 09/12/21 0448 09/12/21 0844 09/12/21 1114  PROCALCITON  --   --  1.74  --   --   LATICACIDVEN >9.0* 5.4*  --  1.4 1.2    Recent Results (from the past 240 hour(s))  Blood Culture (routine x 2)     Status: None (Preliminary result)   Collection Time: 09/11/21  8:55 AM   Specimen: BLOOD  Result Value Ref Range Status   Specimen Description BLOOD BLOOD RIGHT FOREARM  Final   Special Requests   Final    BOTTLES DRAWN AEROBIC AND ANAEROBIC Blood Culture results may not be optimal due to an inadequate volume of blood received in culture bottles   Culture   Final    NO GROWTH 2 DAYS Performed at [facility], [address] [place], [place] [address]    Report Status PENDING  Incomplete  Urine  Culture     Status: Abnormal (Preliminary result)   Collection Time: 09/11/21 10:21 AM   Specimen: In/Out Cath Urine  Result Value Ref Range Status   Specimen Description   Final    IN/OUT CATH URINE Performed at [facility], [address], [place], [place] [address]    Special Requests   Final    NONE Performed at [facility], [address], [place], [place] [address]    Culture 2,000 COLONIES/mL GRAM NEGATIVE RODS (A)  Final   Report Status PENDING  Incomplete  Culture, blood (Routine X 2) w Reflex to ID Panel     Status: None (Preliminary result)   Collection Time: 09/12/21  7:39 AM   Specimen: BLOOD  Result Value Ref Range Status   Specimen Description BLOOD BLOOD LEFT HAND  Final   Special Requests   Final    BOTTLES DRAWN AEROBIC ONLY Blood Culture adequate volume   Culture   Final    NO GROWTH < 24 HOURS Performed at [facility], [address] [place], [place] [address]    Report Status PENDING  Incomplete         Radiology Studies: No results found.      Scheduled Meds:  budesonide (PULMICORT) nebulizer solution  0.5 mg Nebulization Q12H   chlorhexidine  15 mL Mouth Rinse BID   donepezil  10 mg Oral Daily   enoxaparin (LOVENOX) injection  40 mg Subcutaneous Q24H   mouth rinse  15 mL Mouth Rinse q12n4p   methylPREDNISolone (SOLU-MEDROL) injection  125 mg Intravenous Q24H   pantoprazole  40 mg Oral Daily   senna  1 tablet Oral Daily   sertraline  50 mg Oral Daily   tamsulosin  0.4 mg Oral Daily   Continuous Infusions:  ampicillin-sulbactam (UNASYN) IV 3 g (09/13/21 0649)   lactated ringers with kcl 75 mL/hr at 09/13/21 0518    Assessment & Plan:   Principal Problem:   Sepsis (HCC) Active Problems:   COPD with acute exacerbation (HCC)   Acute respiratory failure with hypoxia (HCC)   Acute metabolic encephalopathy   AKI (acute kidney injury) (HCC)   Dementia (HCC)   Essential hypertension    Hypokalemia   Sepsis (HCC) Present on admission as evidenced by tachycardia, tachypnea, leukocytosis and marked lactic acidosis which shows a downward trend Source of sepsis at this time appears to be possible aspiration pneumonia given patient's mental status changes and history of dementia.  5/27 mildly tachycardic blood pressure stable 5/28 ucx 2,000 Gmneg. Rod. Blood culture pending  Lactic acid and WBC trended down Continue Unasyn   Bcx pending LA and wbc trended down Continue iv unasyn     Aspiration Pneumonia SPL started on dysphagia 1 Continue iv unasyn Aspiration precautions HOB elevate during feeding.    Acute respiratory failure with hypoxia (HCC) Most likely secondary to acute COPD exacerbation as well as presumed aspiration pneumonia. Patient required BiPAP upon presentation due to increased work of breathing, tachypnea and use of assessor muscles with room air pulse oximetry of 88%. 5/27 weaned down to 2 L Has expiratory wheezing on exam 5/28 no wheezing today. Decrease solumedrol dose today. Continue iv unasyn   COPD with acute exacerbation (HCC) Patient has an underlying history of COPD and was brought into the ER for evaluation of respiratory distress He initially required BiPAP for increased work of breathing, tachypnea and use of accessory muscles.   He was hypoxic with room air pulse oximetry of 88% 5/28 continue duonebs, iv steroid with po taper. Continue 02 supplementation keeping 02 >92%    AKI (acute kidney injury) (HCC) Likely prerenal from sepsis Improved with IV fluids We will DC IV fluid     Acute metabolic encephalopathy Most likely secondary to sepsis Patient has a history of dementia but according to family members is usually responsive and able to follow simple commands. MS better , appears to be at baseline, but confused which likely from dementia   Dementia (HCC) Continue donepezil and sertraline once patient's mental status  improves   Essential hypertension Stable   Hypokalemia Supplemented and stable   Lactic acidosis Likely due to sepsis and acute respiratory failure Improved with IV fluids and treatment above   DVT prophylaxis: Lovenox Code Status: DNR Family Communication: Daughter updated  Disposition Plan:  Status is: Inpatient Remains inpatient appropriate because: IV treatment. Family wants different facility.spoke to CM        LOS: 2 days   Time spent 35 minutes with more than 50% on COC    Lynn ItoSahar Jeanelle Dake, MD [facility] Pager [phone]  If 7PM-7AM, please contact night-coverage 09/13/2021, 10:43 AM

## 2021-09-13 NOTE — Progress Notes (Signed)
Pt is a yellow MEWS with values below taken at 1900.   09/13/21 1900  Vitals  Temp 98.2 F (36.8 C)  BP (!) 154/96  MAP (mmHg) 112  BP Location Right Arm  BP Method Automatic  Patient Position (if appropriate) Lying  Pulse Rate (!) 105  Pulse Rate Source Monitor  ECG Heart Rate (!) 105  Resp (!) 24  MEWS COLOR  MEWS Score Color Yellow  Oxygen Therapy  SpO2 96 %  O2 Device Nasal Cannula  O2 Flow Rate (L/min) 2 L/min  MEWS Score  MEWS Temp 0  MEWS Systolic 0  MEWS Pulse 1  MEWS RR 1  MEWS LOC 0  MEWS Score 2   Vital signs rechecked at 1915 and patient is now a GREEN MEWS.

## 2021-09-13 NOTE — Progress Notes (Signed)
PT Cancellation Note  Patient Details Name: [name] [name] MRN: [id number] DOB: Jul 03, 1945   Cancelled Treatment:    Reason Eval/Treat Not Completed: PT screened, no needs identified, will sign off PT orders received, chart reviewed. Per chart review & palliative notes, pt requires extensive assist for all aspects of care at baseline. No acute PT needs identified. PT to sign off & MD notified.   Aleda Grana, PT, DPT 09/13/21, 8:18 AM   Sandi Mariscal 09/13/2021, 8:18 AM

## 2021-09-13 NOTE — TOC Progression Note (Addendum)
Transition of Care [facility]) - Progression Note    Patient Details  Name: [name] [name] MRN: [id number] Date of Birth: November 16, 1945  [facility] CM/SW Contact  Bing Quarry, RN Phone Number: 09/13/2021, 5:56 PM  Clinical Narrative: 5/28. Patient has a daughter, Gypsy Decant, as legal guardian. Left VM for her around 6 pm regarding concerns for discharge to a different facility. Contacted other daughter,  Ryken Paschal. She said the legal guardian has a facility in mind but she did not know the name. Asked her to relay message to sister and that weekday CM would reach out again tomorrow. Gabriel Cirri RN CM 1800 pm.    Adams,Gwendolyn (Daughter)  [phone] (Mobile)      Expected Discharge Plan and Services                                                 Social Determinants of Health (SDOH) Interventions    Readmission Risk Interventions     View : No data to display.

## 2021-09-14 DIAGNOSIS — G9341 Metabolic encephalopathy: Secondary | ICD-10-CM | POA: Diagnosis not present

## 2021-09-14 DIAGNOSIS — H44002 Unspecified purulent endophthalmitis, left eye: Secondary | ICD-10-CM

## 2021-09-14 DIAGNOSIS — N179 Acute kidney failure, unspecified: Secondary | ICD-10-CM | POA: Diagnosis not present

## 2021-09-14 DIAGNOSIS — J9601 Acute respiratory failure with hypoxia: Secondary | ICD-10-CM | POA: Diagnosis not present

## 2021-09-14 DIAGNOSIS — E876 Hypokalemia: Secondary | ICD-10-CM

## 2021-09-14 DIAGNOSIS — I10 Essential (primary) hypertension: Secondary | ICD-10-CM

## 2021-09-14 DIAGNOSIS — A419 Sepsis, unspecified organism: Secondary | ICD-10-CM | POA: Diagnosis not present

## 2021-09-14 HISTORY — DX: Unspecified purulent endophthalmitis, left eye: H44.002

## 2021-09-14 MED ORDER — IPRATROPIUM-ALBUTEROL 0.5-2.5 (3) MG/3ML IN SOLN
3.0000 mL | Freq: Four times a day (QID) | RESPIRATORY_TRACT | Status: DC
Start: 2021-09-14 — End: 2021-09-15
  Administered 2021-09-14: 3 mL via RESPIRATORY_TRACT
  Filled 2021-09-14: qty 3

## 2021-09-14 MED ORDER — FUROSEMIDE 20 MG PO TABS
20.0000 mg | ORAL_TABLET | Freq: Once | ORAL | Status: AC
  Administered 2021-09-14: 20 mg via ORAL
  Filled 2021-09-14: qty 1

## 2021-09-14 MED ORDER — AMLODIPINE BESYLATE 5 MG PO TABS
5.0000 mg | ORAL_TABLET | Freq: Every day | ORAL | Status: DC
  Administered 2021-09-14 – 2021-09-15 (×2): 5 mg via ORAL
  Filled 2021-09-14: qty 1

## 2021-09-14 MED ORDER — MOMETASONE FURO-FORMOTEROL FUM 200-5 MCG/ACT IN AERO
2.0000 | INHALATION_SPRAY | Freq: Two times a day (BID) | RESPIRATORY_TRACT | Status: DC
  Filled 2021-09-14: qty 8.8

## 2021-09-14 MED ORDER — NEOMYCIN-POLYMYXIN-DEXAMETH 3.5-10000-0.1 OP SUSP
2.0000 [drp] | OPHTHALMIC | Status: DC
  Administered 2021-09-14 – 2021-09-18 (×25): 2 [drp] via OPHTHALMIC
  Filled 2021-09-14: qty 5

## 2021-09-14 MED ORDER — METHYLPREDNISOLONE SODIUM SUCC 40 MG IJ SOLR
40.0000 mg | INTRAMUSCULAR | Status: DC
  Administered 2021-09-15: 40 mg via INTRAVENOUS
  Filled 2021-09-14: qty 1

## 2021-09-14 NOTE — Progress Notes (Signed)
Patient has been switched from a continuous monitor to MX 40-31. CCMD informed.  Patient is alert but speech is unclear and  he is disoriented this shift.  Antibiotics administered.

## 2021-09-14 NOTE — Care Management Important Message (Signed)
Important Message  Patient Details  Name: [name] [name] MRN: [id number] Date of Birth: [birth year]   Medicare Important Message Given:  Yes     [name] 09/14/2021, 12:40 PM

## 2021-09-14 NOTE — Progress Notes (Signed)
Speech Language Pathology Treatment:    Patient Details Name: [name] [name] MRN: [id number] DOB: 1945/12/24 Today's Date: 09/14/2021 Time: 3500-9381 SLP Time Calculation (min) (ACUTE ONLY): 10 min  Assessment / Plan / Recommendation Clinical Impression  Pt seen for diet tolerance. Pt alert. Notably confused. Speech tangential, fast rate, and non-sensical. Pt consumed breakfast and being fed by NT at bedside. B mitts donned for duration of tx. Pt on 2L/min O2 via [place].  Per chart review, temp WNL. No recent WBC or chest imaging.  Pt observed with items from pureed diet, thin liquid tray. Pt demonstrated a functional oral swallow for pureed textures and thin liquids (via straw sip). Pharyngeal swallow appeared [facility]. No overt or subtle s/sx pharyngeal dysphagia. No change to vocal quality across trials. Offered pt solid trial for possible diet upgrade; however, pt demonstrated refusal-type behaviors (e.g. pulling head away, pursing lips) despite education and encouragement.   Recommend continuation of a pureed diet with thin liquids and safe swallowing strategies/aspiration precautions as outlined below.   Pt may benefit from RD consult given concerns for reduced oral intake PTA (BMI 17.68) and while on altered consistency diet as well as Palliative Consult for ongoing [place] discussions given clinical presentation and hx of dementia.    Pt is at increased risk for aspiration/aspiration PNA given mental status/cognitive deficits, dependence for feeding, and multiple medical comorbidities.    SLP to f/u per POC for diet tolerance and trials of upgraded textures as appropriate.   RN and NT made aware of results, recommendations, and SLP POC.    HPI HPI: Per H&P "[name] [name] is a 76 y.o. male with medical history significant for dementia, hypertension and COPD who was brought into the ER by EMS from [facility] for evaluation of decreased responsiveness and respiratory distress.  Per EMS he  was tachycardic with heart rate of 140, hypotensive with systolic blood pressure in the 80s and blood sugar was 252.  Patient's daughter states that she was called on the morning of his admission by the nursing home staff to inform her that there was sending him to the ER for evaluation of respiratory distress.  Daughter states that he has a known history of COPD but does not wear oxygen.  They have not seen him recently due to [place] concerns but have FaceTimed with him.  Initial blood gas in the ER showed uncompensated respiratory acidosis and patient was placed on a BiPAP.  Labs also showed marked leukocytosis, marked lactic acidosis concerning for possible sepsis of unclear etiology.  Patient received 2 L IV fluid bolus as well as empiric antibiotic therapy with cefepime and Zithromax.  He will be admitted to the hospital for further evaluation"      SLP Plan  Continue with current plan of care      Recommendations for follow up therapy are one component of a multi-disciplinary discharge planning process, led by the attending physician.  Recommendations may be updated based on patient status, additional functional criteria and insurance authorization.    Recommendations  Diet recommendations: Dysphagia 1 (puree);Thin liquid Medication Administration: Crushed with puree Compensations: Minimize environmental distractions;Slow rate;Small sips/bites (assistance with feeding) Postural Changes and/or Swallow Maneuvers: Out of bed for meals;Seated upright 90 degrees;Upright 30-60 min after meal                Oral Care Recommendations: Oral care QID;Staff/trained caregiver to provide oral care Follow Up Recommendations: Skilled nursing-short term rehab (<3 hours/day) Assistance recommended at discharge: Frequent or constant  Supervision/Assistance SLP Visit Diagnosis: Dysphagia, oral phase (R13.11) Plan: Continue with current plan of care          Clyde Canterbury, M.S.,  CCC-SLP Speech-Language Pathologist [facility] [phone] (ASCOM)   Woodroe Chen  09/14/2021, 10:13 AM

## 2021-09-14 NOTE — Progress Notes (Addendum)
PROGRESS NOTE    [name] Menghinihomas Dalporto  ZOX:[id number] DOB: [birth year] DOA: 09/11/2021 PCP: Margaretann LovelessKhan, Neelam S, MD    Brief Narrative:  [name] [name] is a 76 y.o. male with medical history significant for dementia, hypertension and COPD who was brought into the ER by EMS from [facility] for evaluation of decreased responsiveness and respiratory distress. Per EMS he was tachycardic with heart rate of 140, hypotensive with systolic blood pressure in the 80s and blood sugar was 252.  5/27 this a.m. short of breath with forced expiratory wheezing.  Nebs were given.  On facemask.  Receiving nebs 5/28 less tachypnea, off FM. Confused but pleasant 5/29 Lt eye with discharge. SPL consulted-placed dysphagia 1 diet  Consultants:    Procedures:   Antimicrobials:  Unasyn   Subjective: Pleasant, any shortness of breath or chest pain  Objective: Vitals:   09/14/21 0424 09/14/21 0719 09/14/21 1248 09/14/21 1258  BP: (!) 156/91 (!) 159/99 (!) 150/92   Pulse: 78 87 (!) 111 88  Resp: 18 18 19  (!) 24  Temp: 97.6 F (36.4 C) 97.7 F (36.5 C) 99.3 F (37.4 C)   TempSrc: Oral  Oral   SpO2: 96% 96% 90% (!) 88%  Weight:      Height:        Intake/Output Summary (Last 24 hours) at 09/14/2021 1421 Last data filed at 09/14/2021 1410 Gross per 24 hour  Intake 0 ml  Output 1202 ml  Net -1202 ml   Filed Weights   09/11/21 0835 09/12/21 0650  Weight: 51.3 kg 49.7 kg    Examination: Calm, NAD HEENT: mild discharge from Lt eye, no conjunctiva redness Cta no w/r Reg s1/s2 no gallop Soft benign +bs No edema Confused, at baseline Mood and affect appropriate in current setting     Data Reviewed: I have personally reviewed following labs and imaging studies  CBC: Recent Labs  Lab 09/11/21 0836 09/12/21 0448  WBC 20.1* 11.2*  NEUTROABS 15.2*  --   HGB 16.6 14.6  HCT 52.5* 45.3  MCV 89.4 86.9  PLT 312 208   Basic Metabolic Panel: Recent Labs  Lab 09/11/21 0836 09/11/21 1051  09/12/21 0448  NA 142  --  144  K 3.2*  --  4.6  CL 104  --  107  CO2 16*  --  28  GLUCOSE 270*  --  100*  BUN 16  --  10  CREATININE 1.38*  --  0.88  CALCIUM 9.7  --  9.5  MG  --  2.4  --    GFR: Estimated Creatinine Clearance: 51 mL/min (by C-G formula based on SCr of 0.88 mg/dL). Liver Function Tests: Recent Labs  Lab 09/11/21 0836  AST 44*  ALT 18  ALKPHOS 101  BILITOT 0.6  PROT 7.6  ALBUMIN 3.4*   No results for input(s): LIPASE, AMYLASE in the last 168 hours. No results for input(s): AMMONIA in the last 168 hours. Coagulation Profile: Recent Labs  Lab 09/11/21 0836 09/12/21 0448  INR 1.2 1.1   Cardiac Enzymes: No results for input(s): CKTOTAL, CKMB, CKMBINDEX, TROPONINI in the last 168 hours. BNP (last 3 results) No results for input(s): PROBNP in the last 8760 hours. HbA1C: No results for input(s): HGBA1C in the last 72 hours. CBG: No results for input(s): GLUCAP in the last 168 hours. Lipid Profile: No results for input(s): CHOL, HDL, LDLCALC, TRIG, CHOLHDL, LDLDIRECT in the last 72 hours. Thyroid Function Tests: No results for input(s): TSH, T4TOTAL, FREET4, T3FREE, THYROIDAB  in the last 72 hours. Anemia Panel: No results for input(s): VITAMINB12, FOLATE, FERRITIN, TIBC, IRON, RETICCTPCT in the last 72 hours. Sepsis Labs: Recent Labs  Lab 09/11/21 0836 09/11/21 1051 09/12/21 0448 09/12/21 0844 09/12/21 1114  PROCALCITON  --   --  1.74  --   --   LATICACIDVEN >9.0* 5.4*  --  1.4 1.2    Recent Results (from the past 240 hour(s))  Blood Culture (routine x 2)     Status: None (Preliminary result)   Collection Time: 09/11/21  8:55 AM   Specimen: BLOOD  Result Value Ref Range Status   Specimen Description BLOOD BLOOD RIGHT FOREARM  Final   Special Requests   Final    BOTTLES DRAWN AEROBIC AND ANAEROBIC Blood Culture results may not be optimal due to an inadequate volume of blood received in culture bottles   Culture   Final    NO GROWTH 3  DAYS Performed at [facility], [address], [place], [place] [address]    Report Status PENDING  Incomplete  Urine Culture     Status: Abnormal (Preliminary result)   Collection Time: 09/11/21 10:21 AM   Specimen: In/Out Cath Urine  Result Value Ref Range Status   Specimen Description   Final    IN/OUT CATH URINE Performed at [facility], [address], [place], [place] [address]    Special Requests   Final    NONE Performed at [facility], [address], [place], [place] [address]    Culture (A)  Final    2,000 COLONIES/mL ESCHERICHIA COLI SUSCEPTIBILITIES TO FOLLOW Performed at [facility], [address], [place], [place] [address]    Report Status PENDING  Incomplete  Culture, blood (Routine X 2) w Reflex to ID Panel     Status: None (Preliminary result)   Collection Time: 09/12/21  7:39 AM   Specimen: BLOOD  Result Value Ref Range Status   Specimen Description BLOOD BLOOD LEFT HAND  Final   Special Requests   Final    BOTTLES DRAWN AEROBIC ONLY Blood Culture adequate volume   Culture   Final    NO GROWTH 2 DAYS Performed at [facility] [address], [place], [place] [address]    Report Status PENDING  Incomplete         Radiology Studies: No results found.      Scheduled Meds:  amLODipine  5 mg Oral Daily   chlorhexidine  15 mL Mouth Rinse BID   donepezil  10 mg Oral Daily   enoxaparin (LOVENOX) injection  40 mg Subcutaneous Q24H   furosemide  20 mg Oral Once   ipratropium-albuterol  3 mL Nebulization Q6H   mouth rinse  15 mL Mouth Rinse q12n4p   methylPREDNISolone (SOLU-MEDROL) injection  80 mg Intravenous Q24H   neomycin-polymyxin b-dexamethasone  2 drop Left Eye Q4H   pantoprazole  40 mg Oral Daily   senna  1 tablet Oral Daily   sertraline  50 mg Oral Daily   tamsulosin  0.4 mg Oral Daily   Continuous Infusions:  ampicillin-sulbactam (UNASYN) IV 3 g (09/14/21 1313)   lactated ringers  with kcl 75 mL/hr at 09/14/21 1311    Assessment & Plan:   Principal Problem:   Sepsis (HCC) Active Problems:   COPD with acute exacerbation (HCC)   Acute respiratory failure with hypoxia (HCC)   Acute metabolic encephalopathy   AKI (acute kidney injury) (HCC)   Dementia (HCC)  Essential hypertension   Hypokalemia   Eye infection, left   Sepsis (HCC) Present on admission as evidenced by tachycardia, tachypnea, leukocytosis and marked lactic acidosis which shows a downward trend Source of sepsis at this time appears to be possible aspiration pneumonia given patient's mental status changes and history of dementia.  5/29Urine culture with 2000 gram-negative rods "Cultures pending Lactic acid WBC trended down Continue Unasyn to complete course       Aspiration Pneumonia SPL started on dysphagia 1 Aspiration precautions HOB elevate during feeding. 5/29 continue Unasyn as above   Acute respiratory failure with hypoxia (HCC) Most likely secondary to acute COPD exacerbation as well as presumed aspiration pneumonia. Patient required BiPAP upon presentation due to increased work of breathing, tachypnea and use of assessor muscles with room air pulse oximetry of 88%. 5/27 weaned down to 2 L Has expiratory wheezing on exam 5/29 88%, now on 3L. Will give dose of lasix 20mg  x1  Continue Unasyn Dc ivf   COPD with acute exacerbation (HCC) Patient has an underlying history of COPD and was brought into the ER for evaluation of respiratory distress He initially required BiPAP for increased work of breathing, tachypnea and use of accessory muscles.   He was hypoxic with room air pulse oximetry of 88% 5/20 Continue DuoNebs, IV steroids and will transition to p.o. taper , decrease iv steroid to 40mg  qd Oxygen supplementation keeping O2 above 92%    Lt eye infection Will start abx gtt  AKI (acute kidney injury) (HCC) Likely prerenal from sepsis Improved with IV fluids 5/29 dc  ivf     Acute metabolic encephalopathy Most likely secondary to sepsis Patient has a history of dementia but according to family members is usually responsive and able to follow simple commands. MS better , appears to be at baseline, but confused which likely from dementia   Dementia (HCC) Continue donepezil and sertraline once patient's mental status improves   Essential hypertension Stable   Hypokalemia Supplemented and stable   Lactic acidosis Likely due to sepsis and acute respiratory failure Improved with IV fluids and treatment above   DVT prophylaxis: Lovenox Code Status: DNR Family Communication: None at bedside Disposition Plan:  Status is: Inpatient Remains inpatient appropriate because: IV treatment. Family wants different facility.spoke to CM  .  Respiratory status not near baseline yet.      LOS: 3 days   Time spent 55 minutes with more than 50% on [place]    , MD [facility] Pager [phone]  If 7PM-7AM, please contact night-coverage 09/14/2021, 2:21 PM

## 2021-09-15 DIAGNOSIS — G9341 Metabolic encephalopathy: Secondary | ICD-10-CM | POA: Diagnosis not present

## 2021-09-15 DIAGNOSIS — A419 Sepsis, unspecified organism: Secondary | ICD-10-CM | POA: Diagnosis not present

## 2021-09-15 DIAGNOSIS — N179 Acute kidney failure, unspecified: Secondary | ICD-10-CM | POA: Diagnosis not present

## 2021-09-15 DIAGNOSIS — J9601 Acute respiratory failure with hypoxia: Secondary | ICD-10-CM | POA: Diagnosis not present

## 2021-09-15 LAB — URINE CULTURE: Culture: 2000 — AB

## 2021-09-15 LAB — POTASSIUM: Potassium: 3.6 mmol/L (ref 3.5–5.1)

## 2021-09-15 LAB — CREATININE, SERUM
Creatinine, Ser: 0.71 mg/dL (ref 0.61–1.24)
GFR, Estimated: >60 mL/min (ref >60)

## 2021-09-15 MED ORDER — ADULT MULTIVITAMIN W/MINERALS CH
1.0000 | ORAL_TABLET | Freq: Every day | ORAL | Status: DC
  Administered 2021-09-16 – 2021-09-18 (×3): 1 via ORAL
  Filled 2021-09-15 (×3): qty 1

## 2021-09-15 MED ORDER — PREDNISONE 20 MG PO TABS
40.0000 mg | ORAL_TABLET | Freq: Every day | ORAL | Status: DC
  Administered 2021-09-16 – 2021-09-18 (×3): 40 mg via ORAL
  Filled 2021-09-15 (×3): qty 2

## 2021-09-15 MED ORDER — ENSURE ENLIVE PO LIQD
237.0000 mL | Freq: Three times a day (TID) | ORAL | Status: DC
  Administered 2021-09-15 – 2021-09-18 (×7): 237 mL via ORAL

## 2021-09-15 MED ORDER — IPRATROPIUM-ALBUTEROL 0.5-2.5 (3) MG/3ML IN SOLN
3.0000 mL | Freq: Two times a day (BID) | RESPIRATORY_TRACT | Status: DC
  Administered 2021-09-15 – 2021-09-18 (×6): 3 mL via RESPIRATORY_TRACT
  Filled 2021-09-15 (×6): qty 3

## 2021-09-15 NOTE — TOC Progression Note (Signed)
Transition of Care [facility]) - Progression Note    Patient Details  Name: [name] [name] MRN: [id number] Date of Birth: 1945-09-06  [facility] CM/SW Contact  Truddie Hidden, RN Phone Number: 09/15/2021, 2:42 PM  Clinical Narrative:    Spoke with patient's daughter's [name] [name] and [name] [name]. Per family request clinical sent to [facility], [address] [place] [address]         Expected Discharge Plan and Services                                                 Social Determinants of Health (SDOH) Interventions    Readmission Risk Interventions     View : No data to display.

## 2021-09-15 NOTE — Progress Notes (Signed)
PROGRESS NOTE    [name] [name]  [id number] DOB: 23-Oct-1945 DOA: 09/11/2021 PCP: Perrin Maltese, MD    Brief Narrative:  [name] [name] is a 76 y.o. male with medical history significant for dementia, hypertension and COPD who was brought into the ER by EMS from [place] for evaluation of decreased responsiveness and respiratory distress. Per EMS he was tachycardic with heart rate of 140, hypotensive with systolic blood pressure in the 80s and blood sugar was 252. Was treated for aspiration pneumonia.   Was placed on facemask and now down to nasal cannula.  From respiration standpoint has improved.  He did have left eye infection was started on eyedrops.  Had SPL consulted due to possible aspiration and was placed on dysphagia 1 diet.  He is confused as baseline.  He could be discharged however family does not want him to go back to the place he was living and case management are working to find a new SNF.   Consultants:    Procedures:   Antimicrobials:  Unasyn   Subjective: Pleasant no complaints. Denies sob. At baseline confusion  Objective: Vitals:   09/14/21 2008 09/14/21 2150 09/14/21 2359 09/15/21 0430  BP:  99/75 102/71 99/69  Pulse:  94 (!) 102 97  Resp:  19 18 19   Temp:  98.3 F (36.8 C) 98.1 F (36.7 C) 97.9 F (36.6 C)  TempSrc:  Oral Oral Oral  SpO2: 99% 100% 99% 98%  Weight:      Height:        Intake/Output Summary (Last 24 hours) at 09/15/2021 0835 Last data filed at 09/14/2021 1410 Gross per 24 hour  Intake 0 ml  Output 2 ml  Net -2 ml   Filed Weights   09/11/21 0835 09/12/21 0650  Weight: 51.3 kg 49.7 kg    Examination: Calm, NAD Cta no w/r Reg s1/s2 no gallop Soft benign +bs No edema In mittens, confused Mood and affect appropriate in current setting     Data Reviewed: I have personally reviewed following labs and imaging studies  CBC: Recent Labs  Lab 09/11/21 0836 09/12/21 0448  WBC 20.1* 11.2*  NEUTROABS 15.2*   --   HGB 16.6 14.6  HCT 52.5* 45.3  MCV 89.4 86.9  PLT 312 [id number]   Basic Metabolic Panel: Recent Labs  Lab 09/11/21 0836 09/11/21 1051 09/12/21 0448 09/15/21 0645  NA 142  --  144  --   K 3.2*  --  4.6 3.6  CL 104  --  107  --   CO2 16*  --  28  --   GLUCOSE 270*  --  100*  --   BUN 16  --  10  --   CREATININE 1.38*  --  0.88 0.71  CALCIUM 9.7  --  9.5  --   MG  --  2.4  --   --    GFR: Estimated Creatinine Clearance: 56.1 mL/min (by C-G formula based on SCr of 0.71 mg/dL). Liver Function Tests: Recent Labs  Lab 09/11/21 0836  AST 44*  ALT 18  ALKPHOS 101  BILITOT 0.6  PROT 7.6  ALBUMIN 3.4*   No results for input(s): LIPASE, AMYLASE in the last 168 hours. No results for input(s): AMMONIA in the last 168 hours. Coagulation Profile: Recent Labs  Lab 09/11/21 0836 09/12/21 0448  INR 1.2 1.1   Cardiac Enzymes: No results for input(s): CKTOTAL, CKMB, CKMBINDEX, TROPONINI in the last 168 hours. BNP (last 3 results) No results for  input(s): PROBNP in the last 8760 hours. HbA1C: No results for input(s): HGBA1C in the last 72 hours. CBG: No results for input(s): GLUCAP in the last 168 hours. Lipid Profile: No results for input(s): CHOL, HDL, LDLCALC, TRIG, CHOLHDL, LDLDIRECT in the last 72 hours. Thyroid Function Tests: No results for input(s): TSH, T4TOTAL, FREET4, T3FREE, THYROIDAB in the last 72 hours. Anemia Panel: No results for input(s): VITAMINB12, FOLATE, FERRITIN, TIBC, IRON, RETICCTPCT in the last 72 hours. Sepsis Labs: Recent Labs  Lab 09/11/21 0836 09/11/21 1051 09/12/21 0448 09/12/21 0844 09/12/21 1114  PROCALCITON  --   --  1.74  --   --   LATICACIDVEN >9.0* 5.4*  --  1.4 1.2    Recent Results (from the past 240 hour(s))  Blood Culture (routine x 2)     Status: None (Preliminary result)   Collection Time: 09/11/21  8:55 AM   Specimen: BLOOD  Result Value Ref Range Status   Specimen Description BLOOD BLOOD RIGHT FOREARM  Final   Special  Requests   Final    BOTTLES DRAWN AEROBIC AND ANAEROBIC Blood Culture results may not be optimal due to an inadequate volume of blood received in culture bottles   Culture   Final    NO GROWTH 4 DAYS Performed at [facility], [address] [place], [place] [address]    Report Status PENDING  Incomplete  Urine Culture     Status: Abnormal (Preliminary result)   Collection Time: 09/11/21 10:21 AM   Specimen: In/Out Cath Urine  Result Value Ref Range Status   Specimen Description   Final    IN/OUT CATH URINE Performed at [facility], [address] [place], [place] [address]    Special Requests   Final    NONE Performed at [facility], [address] [place], [place] [address]    Culture (A)  Final    2,000 COLONIES/mL ESCHERICHIA COLI SUSCEPTIBILITIES TO FOLLOW Performed at [facility], [place] [address] [place], [place] [address]    Report Status PENDING  Incomplete  Culture, blood (Routine X 2) w Reflex to ID Panel     Status: None (Preliminary result)   Collection Time: 09/12/21  7:39 AM   Specimen: BLOOD  Result Value Ref Range Status   Specimen Description BLOOD BLOOD LEFT HAND  Final   Special Requests   Final    BOTTLES DRAWN AEROBIC ONLY Blood Culture adequate volume   Culture   Final    NO GROWTH 3 DAYS Performed at [facility], [address], [place], [place] [address]    Report Status PENDING  Incomplete         Radiology Studies: No results found.      Scheduled Meds:  amLODipine  5 mg Oral Daily   chlorhexidine  15 mL Mouth Rinse BID   donepezil  10 mg Oral Daily   enoxaparin (LOVENOX) injection  40 mg Subcutaneous Q24H   ipratropium-albuterol  3 mL Nebulization Q12H   mouth rinse  15 mL Mouth Rinse q12n4p   methylPREDNISolone (SOLU-MEDROL) injection  40 mg Intravenous Q24H   neomycin-polymyxin b-dexamethasone  2 drop Left Eye Q4H   pantoprazole  40 mg Oral Daily   senna  1 tablet Oral Daily    sertraline  50 mg Oral Daily   tamsulosin  0.4 mg Oral Daily   Continuous Infusions:  ampicillin-sulbactam (UNASYN) IV 3 g (09/15/21 0542)    Assessment & Plan:   Principal  Problem:   Sepsis ([place]) Active Problems:   COPD with acute exacerbation (HCC)   Acute respiratory failure with hypoxia (HCC)   Acute metabolic encephalopathy   AKI (acute kidney injury) ([place])   Dementia ([place])   Essential hypertension   Hypokalemia   Eye infection, left   Sepsis ([place]) Present on admission as evidenced by tachycardia, tachypnea, leukocytosis and marked lactic acidosis which shows a downward trend Source of sepsis at this time appears to be possible aspiration pneumonia given patient's mental status changes and history of dementia.  Urine culture with 2000 gram-negative rods 5/30 bcx TDN Lactic and wbc down Continue unasyn to complete course 5/5 to cover asp. Pna and UTI       Aspiration Pneumonia SPL started on dysphagia 1 Aspiration precautions HOB elevate during feeding. 5/30 continue unasyn to complete course     Acute respiratory failure with hypoxia (HCC) Most likely secondary to acute COPD exacerbation as well as presumed aspiration pneumonia. Patient required BiPAP upon presentation due to increased work of breathing, tachypnea and use of assessor muscles with room air pulse oximetry of 88%. 5/27 weaned down to 2 L Has expiratory wheezing on exam 5/29 88%, now on 3L. Will give dose of lasix 20mg  x1  5/30 On RA today. Clinically improving will switch iv steroids to po prednisone with taper to start tomorrow.  Continue Unasyn   [facility]ation [facility] Patient has an underlying history of COPD and was brought into the ER for evaluation of respiratory distress He initially required BiPAP for increased work of breathing, tachypnea and use of accessory muscles.   He was hypoxic with room air pulse oximetry of 88% 5/30 clinically improving On RA now Switch iv  steroid to po on 5/31   Lt eye infection On abx gtt.  To Complete 5 days total  AKI (acute kidney injury) ([place]) Likely prerenal from sepsis Improved with IV fluids IVF discontinued.      Acute metabolic encephalopathy Most likely secondary to sepsis Patient has a history of dementia but according to family members is usually responsive and able to follow simple commands. MS better , appears to be at baseline, but confused which likely from dementia   Dementia ([place]) Continue Zoloft and donepezil   Essential hypertension Stable   Hypokalemia Supplemented and stable   Lactic acidosis Likely due to sepsis and acute respiratory failure Improved with IV fluids and treatment above   DVT prophylaxis: Lovenox Code Status: DNR Family Communication: None at bedside Disposition Plan: SNF  Status is: Inpatient Remains inpatient appropriate because: Clinically stable, unsafe discharge.  SNF pending    LOS: 4 days   Time spent 35 minutes with more than 50% on [place], MD [facility] Pager [phone]  If 7PM-7AM, please contact night-coverage 09/15/2021, 8:35 AM

## 2021-09-15 NOTE — NC FL2 (Signed)
[place] MEDICAID FL2 LEVEL OF CARE SCREENING TOOL     IDENTIFICATION  Patient Name: [name] [name] Birthdate: 07/11/1945 Sex: male Admission Date (Current Location): 09/11/2021  [place] and [place] Number:  [name] [id number] K Facility and Address:  [facility], [address] [place], [place] [address]      Provider Number: [id number]  Attending Physician Name and Address:  Lynn Ito, MD  Relative Name and Phone Number:  Gypsy Decant [phone]    Current Level of Care: Hospital Recommended Level of Care: Skilled Nursing Facility Prior Approval Number:    Date Approved/Denied:   PASRR Number: [id number]  Discharge Plan: SNF    Current Diagnoses: Patient Active Problem List   Diagnosis Date Noted   Eye infection, left 09/14/2021   Acute metabolic encephalopathy 09/11/2021   Dementia (HCC) 09/11/2021   COPD with acute exacerbation (HCC) 09/11/2021   Essential hypertension 09/11/2021   Acute respiratory failure with hypoxia (HCC) 09/11/2021   AKI (acute kidney injury) (HCC) 09/11/2021   Hypokalemia 09/11/2021   Sepsis (HCC) 02/08/2017   UTI (urinary tract infection) 02/08/2017   Alcoholism (HCC) 02/08/2017    Orientation RESPIRATION BLADDER Height & Weight      (Disoriented x4)  O2 (022L) External catheter Weight: 49.7 kg Height:  5\' 6"  (167.6 cm)  BEHAVIORAL SYMPTOMS/MOOD NEUROLOGICAL BOWEL NUTRITION STATUS      Incontinent Diet (Dysphagia 1 (puree);Thin liquid)  AMBULATORY STATUS COMMUNICATION OF NEEDS Skin   Total Care Non-Verbally (Incomprehensible) Normal                       Personal Care Assistance Level of Assistance  Total care       Total Care Assistance: Maximum assistance   Functional Limitations Info             SPECIAL CARE FACTORS FREQUENCY                       Contractures Contractures Info: Not present    Additional Factors Info  Code Status, Allergies Code Status Info:  DNR Allergies Info: No Known Allergies           Current Medications (09/15/2021):  This is the current hospital active medication list Current Facility-Administered Medications  Medication Dose Route Frequency Provider Last Rate Last Admin   acetaminophen (TYLENOL) tablet 650 mg  650 mg Oral Q4H PRN Agbata, Tochukwu, MD       Ampicillin-Sulbactam (UNASYN) 3 g in sodium chloride 0.9 % 100 mL IVPB  3 g Intravenous Q6H 09/17/2021, RPH 200 mL/hr at 09/15/21 1121 3 g at 09/15/21 1121   chlorhexidine (PERIDEX) 0.12 % solution 15 mL  15 mL Mouth Rinse BID 09/17/21, MD   15 mL at 09/15/21 0807   donepezil (ARICEPT) tablet 10 mg  10 mg Oral Daily Agbata, Tochukwu, MD   10 mg at 09/15/21 0807   enoxaparin (LOVENOX) injection 40 mg  40 mg Subcutaneous Q24H Agbata, Tochukwu, MD   40 mg at 09/14/21 2204   hydrALAZINE (APRESOLINE) injection 10 mg  10 mg Intravenous Q6H PRN 2205, MD   10 mg at 09/14/21 1256   ipratropium-albuterol (DUONEB) 0.5-2.5 (3) MG/3ML nebulizer solution 3 mL  3 mL Nebulization Q6H PRN Agbata, Tochukwu, MD   3 mL at 09/14/21 1257   ipratropium-albuterol (DUONEB) 0.5-2.5 (3) MG/3ML nebulizer solution 3 mL  3 mL Nebulization Q12H 09/16/21, MD       MEDLINE mouth rinse  15 mL Mouth Rinse q12n4p Lynn Ito, MD   15 mL at 09/14/21 1314   methylPREDNISolone sodium succinate (SOLU-MEDROL) 40 mg/mL injection 40 mg  40 mg Intravenous Q24H Lynn Ito, MD   40 mg at 09/15/21 6734   neomycin-polymyxin b-dexamethasone (MAXITROL) ophthalmic suspension 2 drop  2 drop Left Eye Q4H Ronnald Ramp, RPH   2 drop at 09/15/21 1121   ondansetron (ZOFRAN) tablet 4 mg  4 mg Oral Q6H PRN Agbata, Tochukwu, MD       Or   ondansetron (ZOFRAN) injection 4 mg  4 mg Intravenous Q6H PRN Agbata, Tochukwu, MD       pantoprazole (PROTONIX) EC tablet 40 mg  40 mg Oral Daily Agbata, Tochukwu, MD   40 mg at 09/15/21 1937   senna (SENOKOT) tablet 8.6 mg  1 tablet Oral Daily Agbata, Tochukwu, MD    8.6 mg at 09/15/21 9024   sertraline (ZOLOFT) tablet 50 mg  50 mg Oral Daily Agbata, Tochukwu, MD   50 mg at 09/15/21 0973   tamsulosin (FLOMAX) capsule 0.4 mg  0.4 mg Oral Daily Agbata, Tochukwu, MD   0.4 mg at 09/15/21 0807     Discharge Medications: Please see discharge summary for a list of discharge medications.  Relevant Imaging Results:  Relevant Lab Results:   Additional Information Patient's SS# [id number]  Truddie Hidden, RN

## 2021-09-16 DIAGNOSIS — E43 Unspecified severe protein-calorie malnutrition: Secondary | ICD-10-CM | POA: Diagnosis present

## 2021-09-16 DIAGNOSIS — A419 Sepsis, unspecified organism: Secondary | ICD-10-CM | POA: Diagnosis not present

## 2021-09-16 DIAGNOSIS — R6520 Severe sepsis without septic shock: Secondary | ICD-10-CM | POA: Diagnosis not present

## 2021-09-16 LAB — CULTURE, BLOOD (ROUTINE X 2): Culture: NO GROWTH

## 2021-09-16 NOTE — Progress Notes (Signed)
Nutrition Follow-up  DOCUMENTATION CODES:   Severe malnutrition in context of chronic illness, Underweight  INTERVENTION:   -Continue Ensure Enlive po TID, each supplement provides 350 kcal and 20 grams of protein.  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  Ongoing  GOAL:   Patient will meet greater than or equal to 90% of their needs  Progressing   MONITOR:   PO intake, Supplement acceptance, Diet advancement  REASON FOR ASSESSMENT:   Consult Assessment of nutrition requirement/status  ASSESSMENT:   Pt with medical history significant for dementia, hypertension and COPD who was brought in for evaluation of decreased responsiveness and respiratory distress.  5/28- s/p BSE- advanced to dysphagia 1 diet with thin liquids   Reviewed I/O's: -260 ml x 24 hours and +2.5 L since admission  UOP: 500 ml x 24 hours  Pt sitting up in bed at time of visit. Noted he consumed 100% of his breakfast. He was drinking a carton of milk and had a few sips of Ensure. Pt confused and did not answer questions appropriately. Pt states to this RD that he is "at the place where I work and live"; attempted to reorient pt unsuccessfully. Noted meal completions 100%.  Reviewed wt hx; wt has been stable over the past 3 months.   Per TOC notes, plan for SNF placement.   Medications reviewed and include prednisone and senokot.   Labs reviewed.   NUTRITION - FOCUSED PHYSICAL EXAM:  Flowsheet Row Most Recent Value  Orbital Region Mild depletion  Upper Arm Region Severe depletion  Thoracic and Lumbar Region Severe depletion  Buccal Region Mild depletion  Temple Region Severe depletion  Clavicle Bone Region Severe depletion  Clavicle and Acromion Bone Region Severe depletion  Scapular Bone Region Severe depletion  Dorsal Hand Severe depletion  Patellar Region Severe  depletion  Anterior Thigh Region Severe depletion  Posterior Calf Region Severe depletion  Edema (RD Assessment) None  Hair Reviewed  Eyes Reviewed  Mouth Reviewed  Skin Reviewed  Nails Reviewed       Diet Order:   Diet Order             DIET - DYS 1 Room service appropriate? No; Fluid consistency: Thin  Diet effective now                   EDUCATION NEEDS:   No education needs have been identified at this time  Skin:  Skin Assessment: Reviewed RN Assessment  Last BM:  09/15/21  Height:   Ht Readings from Last 1 Encounters:  09/12/21 5\' 6"  (1.676 m)    Weight:   Wt Readings from Last 1 Encounters:  09/12/21 49.7 kg    Ideal Body Weight:  64.5 kg  BMI:  Body mass index is 17.68 kg/m.  Estimated Nutritional Needs:   Kcal:  1600-1800  Protein:  85-100 grams  Fluid:  > 1.6 L    09/14/21, RD, LDN, CDCES Registered Dietitian II Certified Diabetes Care and Education Specialist Please refer to [facility] for RD and/or RD on-call/weekend/after hours pager

## 2021-09-16 NOTE — Progress Notes (Signed)
Progress Note   [name] [id number] DOB: 07/16/1945 DOA: 09/11/2021     5 DOS: the patient was seen and examined on 09/16/2021   Brief hospital course: [name] [name] is a 76 y.o. male with medical history significant for dementia, hypertension and COPD who was brought into the ER by EMS from [facility] healthcare for evaluation of decreased responsiveness and respiratory distress.  Per EMS he was tachycardic with heart rate of 140, hypotensive with systolic blood pressure in the 80s and blood sugar was 252. Was treated for aspiration pneumonia.    Was placed on facemask and now down to nasal cannula.  From respiration standpoint has improved.    Patient also had a left eye infection was started on eyedrops.  SLP consulted due to possible aspiration and was placed on dysphagia 1 diet.  He is confused as baseline.    Medically stable for discharge back to SNF, however family does not want him to go back to the place he was living and [facility] working to find a new SNF.  Of note, patient is total care at baseline, needs long term placement.  Assessment and Plan: * Severe sepsis (HCC) Present on admission as evidenced by tachycardia, tachypnea, leukocytosis and marked lactic acidosis which improved.  Source likely aspiration pneumonia given patient's mental status changes and history of dementia, possible UTI.  Urine culture with pan-sensitive E. Coli. Blood cultures negative, final. Sepsis physiology improved. --Treated with 5 days Unasyn  --Monitor fever curve, CBC, hemodynamics   Acute respiratory failure with hypoxia (HCC) Secondary to COPD exacerbation and likely aspiration pneumonia.  Initially required BiPAP due to significant respiratory distress with tachypnea and increased work of breathing use of accessory muscles.  Initial pulse ox on room air was 88%. -- Supplemental O2 to maintain sats 88 to 93% wean as tolerated -- Given single dose 20 mg Lasix on 5/29 --Manage  pneumonia and COPD as outlined  COPD with acute exacerbation (HCC) Presented in respiratory distress requiring BiPAP for increased work of breathing, tachypnea and use of accessory muscles.   He was hypoxic with room air pulse oximetry of 88% Now stable off BiPAP on nasal cannula O2. --Continue prednisone -- Continue bronchodilators -- Inhaled steroid --Completed 5-day Unasyn for probable aspiration pneumonia   AKI (acute kidney injury) (HCC) Likely prerenal azotemia from sepsis. Renal function improved with IV fluids Off IV fluids. -- Monitor BMP  Acute metabolic encephalopathy Superimposed on baseline dementia in the setting of infection.  Mental status appears back to baseline.  Due to dementia, baseline confusion is chronic. --Delirium precautions   Dementia (HCC) Continue Aricept and Zoloft Delirium precautions  Essential hypertension Blood pressure is stable Monitor BP  Hypokalemia Resolved with replacement.  Monitor BMP replace K as needed  Protein-calorie malnutrition, severe Related to chronic illness (COPD, dementia) as evidenced by severe fat and muscle depletion. -- Appreciate dietitian recommendations -- Started on Ensure supplements -- Multivitamins and minerals  Eye infection, left Continue antibiotic drops to complete 5 days        Subjective: Patient was asleep but woke easily to voice during my encounter.  He is nonverbal for me.  No acute events reported and he appears in no distress.  Physical Exam: Vitals:   09/15/21 1931 09/15/21 2024 09/16/21 0447 09/16/21 0812  BP: (!) 127/97  (!) 127/94 132/82  Pulse: 97  77 67  Resp: 20  17 17   Temp: 97.7 F (36.5 C)  98.2 F (36.8 C) 97.7 F (36.5  C)  TempSrc:      SpO2: 95% 95% 99%   Weight:      Height:       General exam: awake, alert, no acute distress, underweight HEENT: Nasal cannula in place, moist mucus membranes, hearing grossly normal  Respiratory system: CTAB, intermittent  expiratory wheezes, no rhonchi, normal respiratory effort at rest on 2 L/min nasal cannula O2. Cardiovascular system: normal S1/S2, RRR, no pedal edema.   Gastrointestinal system: soft, NT, ND Central nervous system: Grossly nonfocal but limited exam due to patient dementia not following commands Extremities: Mitten on left hand, no edema, normal tone Skin: dry, intact, normal temperature Psychiatry: normal mood, congruent affect, abnormal judgment and insight due to dementia   Data Reviewed:  Labs today unremarkable including normal potassium and creatinine, normal lactic acid  Family Communication: None  Disposition: Status is: Inpatient Remains inpatient appropriate because: Pending SNF placement.  Family declined for patient to return to [facility] healthcare where he has been a long-term resident in total care.    Planned Discharge Destination: Skilled nursing facility    Time spent: 35 minutes  Author: Pennie Banter, DO 09/16/2021 5:09 PM  For on call review [email or web address].

## 2021-09-16 NOTE — Hospital Course (Addendum)
[name] [name] is a 76 y.o. male with medical history significant for dementia, hypertension and COPD who was brought into the ER by EMS from [facility] healthcare for evaluation of decreased responsiveness and respiratory distress.  Per EMS he was tachycardic with heart rate of 140, hypotensive with systolic blood pressure in the 80s and blood sugar was 252. Was treated for aspiration pneumonia.    Was placed on facemask and now down to nasal cannula.  From respiration standpoint has improved.    Patient also had a left eye infection was started on eyedrops.  SLP consulted due to possible aspiration and was placed on dysphagia 1 diet.  He is confused as baseline.    Medically stable for discharge back to SNF, however family does not want him to go back to the place he was living and [facility] working to find a new SNF.  Of note, patient is total care at baseline, needs long term placement.

## 2021-09-16 NOTE — Assessment & Plan Note (Addendum)
POA, resolved.  Treated with antibiotic drops

## 2021-09-16 NOTE — TOC Progression Note (Signed)
Transition of Care [facility]) - Progression Note    Patient Details  Name: [name] [name] MRN: [id number] Date of Birth: 08-17-1945  Transition of Care [facility]) CM/SW Contact  Caryn Section, RN Phone Number: 09/16/2021, 10:03 AM  Clinical Narrative:   Awaiting therapy notes to determine SNF placement         Expected Discharge Plan and Services                                                 Social Determinants of Health (SDOH) Interventions    Readmission Risk Interventions     View : No data to display.

## 2021-09-16 NOTE — Progress Notes (Signed)
Speech Language Pathology Treatment: Dysphagia  Patient Details Name: [name] [name] MRN: [id number] DOB: 1945-04-23 Today's Date: 09/16/2021 Time: 1710-1730 SLP Time Calculation (min) (ACUTE ONLY): 20 min  Assessment / Plan / Recommendation Clinical Impression  Pt seen for toleration of current oral diet; need to upgrade diet consistency. Dietician reported pt is eating ~100% at most meals w/ the Pureed foods. Pt has advanced Dementia baseline. Pt is on RA; afebrile.   Pt consumed trials of purees and thin liquids via straw w/ No overt clinical s/s of aspiration noted: no decline in vocal quality; no cough, and no decline in respiratory status during/post trials. Oral phase was adequate for bolus management and oral clearing of the boluses given; Timely. No lengthy bolus management/time needed for the Pureed consistency foods, nor the liquids. Suspect conducive consistencies in setting of degree of Cognitive decline -- easier oral phase management for him w/ the puree foods, liquids. Pt attempted self-feeding by holding cup but required min-mod support and guidance d/t the Cognitive decline. Feeding self improves safety of swallowing. Hand over hand guidance and visual cues were helpful to initiate tasks.         In setting of baseline Dementia and Cognitive decline, Missing MOST Dentition, and risk for choking/aspiration, recommend continuing the dysphagia level 1(PUREE) foods moistened for flavor w/ thin liquids; general aspiration precautions; reduce Distractions during meals and engage pt during meals for self-feeding. Pills Crushed in Puree for safer swallowing as needed. Support w/ feeding at meals as needed. NSG updated.  ST services recommends follow w/ Palliative Care for [place] and education re: impact of Cognitive decline/Dementia on swallowing. Suspect pt is close to/at his baseline. Precautions posted in room.  Recommend continue this diet consistency now and at Discharge in setting of  advanced Cognitive decline, Dementia. This diet consistency will be optimal for oral intake and support of his nutrition. Dietician f/u for support also.     HPI HPI: Per H&P "[name] [name] is a 76 y.o. male with medical history significant for dementia, hypertension and COPD who was brought into the ER by EMS from [place] for evaluation of decreased responsiveness and respiratory distress.  Per EMS he was tachycardic with heart rate of 140, hypotensive with systolic blood pressure in the 80s and blood sugar was 252.  Patient's daughter states that she was called on the morning of his admission by the nursing home staff to inform her that there was sending him to the ER for evaluation of respiratory distress.  Daughter states that he has a known history of COPD but does not wear oxygen.  They have not seen him recently due to [place] concerns but have FaceTimed with him.  Initial blood gas in the ER showed uncompensated respiratory acidosis and patient was placed on a BiPAP.  Labs also showed marked leukocytosis, marked lactic acidosis concerning for possible sepsis of unclear etiology.  Patient received 2 L IV fluid bolus as well as empiric antibiotic therapy with cefepime and Zithromax.  He will be admitted to the hospital for further evaluation"      SLP Plan  All goals met      Recommendations for follow up therapy are one component of a multi-disciplinary discharge planning process, led by the attending physician.  Recommendations may be updated based on patient status, additional functional criteria and insurance authorization.    Recommendations  Diet recommendations: Dysphagia 1 (puree);Thin liquid (w/ gravies to moisten) Liquids provided via: Straw;Cup Medication Administration: Crushed with puree (for safer  swallowing) Supervision: Staff to assist with self feeding;Full supervision/cueing for compensatory strategies Compensations: Minimize environmental distractions;Slow  rate;Small sips/bites;Follow solids with liquid Postural Changes and/or Swallow Maneuvers: Out of bed for meals;Seated upright 90 degrees;Upright 30-60 min after meal                General recommendations:  (Dietician f/u) Oral Care Recommendations: Oral care BID;Oral care before and after PO;Staff/trained caregiver to provide oral care Follow Up Recommendations: Skilled nursing-short term rehab (<3 hours/day) (TBD) Assistance recommended at discharge: Frequent or constant Supervision/Assistance SLP Visit Diagnosis: Dysphagia, oral phase (R13.11) (baseline Dementia, Cognitive decline) Plan: All goals met            Orinda Kenner, MS, [place] Speech Language Pathologist Rehab Services; [place] [phone] (ascom) Zahira Brummond  09/16/2021, 5:32 PM

## 2021-09-16 NOTE — Assessment & Plan Note (Signed)
Related to chronic illness (COPD, dementia) as evidenced by severe fat and muscle depletion. -- Appreciate dietitian recommendations -- Started on Ensure supplements -- Multivitamins and minerals

## 2021-09-17 DIAGNOSIS — R6520 Severe sepsis without septic shock: Secondary | ICD-10-CM | POA: Diagnosis not present

## 2021-09-17 DIAGNOSIS — A419 Sepsis, unspecified organism: Secondary | ICD-10-CM | POA: Diagnosis not present

## 2021-09-17 LAB — CULTURE, BLOOD (ROUTINE X 2)
Culture: NO GROWTH
Special Requests: ADEQUATE

## 2021-09-17 LAB — MRSA NEXT GEN BY PCR, NASAL: MRSA by PCR Next Gen: DETECTED — AB

## 2021-09-17 MED ORDER — MUPIROCIN 2 % EX OINT
1.0000 "application " | TOPICAL_OINTMENT | Freq: Two times a day (BID) | CUTANEOUS | Status: DC
  Administered 2021-09-17 – 2021-09-18 (×3): 1 via NASAL
  Filled 2021-09-17: qty 22

## 2021-09-17 MED ORDER — CHLORHEXIDINE GLUCONATE CLOTH 2 % EX PADS
6.0000 | MEDICATED_PAD | Freq: Every day | CUTANEOUS | Status: DC
  Administered 2021-09-18: 6 via TOPICAL

## 2021-09-17 MED ORDER — PANTOPRAZOLE 2 MG/ML SUSPENSION
40.0000 mg | Freq: Every day | ORAL | Status: DC
  Administered 2021-09-18: 40 mg via ORAL
  Filled 2021-09-17: qty 20

## 2021-09-17 NOTE — Progress Notes (Signed)
PT Cancellation Note  Patient Details Name: [name] [name] MRN: [id number] DOB: 1946/02/23   Cancelled Treatment:    Reason Eval/Treat Not Completed: PT screened, no needs identified, will sign off Per discussion with care facility, patient bed-bound at baseline, uses hoyer lift with staff assist for transfers between all seating surfaces;  WC level for mobilization, staff assist for propulsion; ADLs at bed level, dependant; does feed self with set up/supervision from staff.  Was not participating with therapy at facility; care provided under LTC.  Appears to be at functional baseline; no acute change in status, no indicate for acute PT/OT services.  Appropriate for return to LTC when medically appropriate for discharge from hospital.   Tommy [place]. Manson Passey, PT, DPT, NCS 09/17/21, 12:24 PM [phone]

## 2021-09-17 NOTE — Progress Notes (Signed)
Progress Note   [name] [id number] DOB: [birth year] DOA: 09/11/2021     6 DOS: the patient was seen and examined on 09/17/2021   Brief hospital course: [name] [name] is a 76 y.o. male with medical history significant for dementia, hypertension and COPD who was brought into the ER by EMS from [facility] healthcare for evaluation of decreased responsiveness and respiratory distress.  Per EMS he was tachycardic with heart rate of 140, hypotensive with systolic blood pressure in the 80s and blood sugar was 252. Was treated for aspiration pneumonia.    Was placed on facemask and now down to nasal cannula.  From respiration standpoint has improved.    Patient also had a left eye infection was started on eyedrops.  SLP consulted due to possible aspiration and was placed on dysphagia 1 diet.  He is confused as baseline.    Medically stable for discharge back to SNF, however family does not want him to go back to the place he was living and [facility] working to find a new SNF.  Of note, patient is total care at baseline, needs long term placement.  Assessment and Plan: * Severe sepsis (HCC) Present on admission as evidenced by tachycardia, tachypnea, leukocytosis and marked lactic acidosis which improved.  Source likely aspiration pneumonia given patient's mental status changes and history of dementia, possible UTI.  Urine culture with pan-sensitive E. Coli. Blood cultures negative, final. Sepsis physiology improved. --Treated with 5 days Unasyn  --Monitor fever curve, CBC, hemodynamics   Acute respiratory failure with hypoxia (HCC) Secondary to COPD exacerbation and likely aspiration pneumonia.  Initially required BiPAP due to significant respiratory distress with tachypnea and increased work of breathing use of accessory muscles.  Initial pulse ox on room air was 88%. -- Supplemental O2 to maintain sats 88 to 93% wean as tolerated -- Given single dose 20 mg Lasix on 5/29 --Manage  pneumonia and COPD as outlined  COPD with acute exacerbation (HCC) Presented in respiratory distress requiring BiPAP for increased work of breathing, tachypnea and use of accessory muscles.   He was hypoxic with room air pulse oximetry of 88% Now stable off BiPAP on nasal cannula O2. --Continue prednisone -- Continue bronchodilators -- Inhaled steroid --Completed 5-day Unasyn for probable aspiration pneumonia   AKI (acute kidney injury) (HCC) Likely prerenal azotemia from sepsis. Renal function improved with IV fluids Off IV fluids. -- Monitor BMP  Acute metabolic encephalopathy Superimposed on baseline dementia in the setting of infection.  Mental status appears back to baseline.  Due to dementia, baseline confusion is chronic. --Delirium precautions   Dementia (HCC) Continue Aricept and Zoloft Delirium precautions  Essential hypertension Blood pressure is stable Monitor BP  Hypokalemia Resolved with replacement.  Monitor BMP replace K as needed  Protein-calorie malnutrition, severe Related to chronic illness (COPD, dementia) as evidenced by severe fat and muscle depletion. -- Appreciate dietitian recommendations -- Started on Ensure supplements -- Multivitamins and minerals  Eye infection, left Continue antibiotic drops to complete 5 days        Subjective: Patient was awake and more interactive today.  He does not follow commands and speech content is unrelated to questions I ask.  In good spirits.  No acute events reported.   Physical Exam: Vitals:   09/16/21 0812 09/16/21 1957 09/17/21 0514 09/17/21 0818  BP: 132/82 (!) 147/99 135/87 130/79  Pulse: 67 91 88 85  Resp: 17 18 17 15   Temp: 97.7 F (36.5 C) 98 F (36.7 C) 98.2  F (36.8 C) 98.2 F (36.8 C)  TempSrc:  Oral Oral   SpO2:  94% 98% 97%  Weight:      Height:       General exam: awake, alert, no acute distress, underweight HEENT: moist mucus membranes, hearing grossly normal  Respiratory  system: CTAB, no wheezes, no rhonchi, normal respiratory effort at rest on 2 L/min nasal cannula O2. Cardiovascular system: normal S1/S2, RRR, no pedal edema.   Central nervous system: Grossly nonfocal but limited exam due to patient dementia not following commands Extremities: moves all, no edema, normal tone Psychiatry: normal mood, congruent affect, abnormal judgment and insight due to dementia   Data Reviewed:  Labs today unremarkable including normal potassium and creatinine, normal lactic acid  Family Communication: None  Disposition: Status is: Inpatient Remains inpatient appropriate because: Pending SNF placement.  Family declined for patient to return to [facility] healthcare where he has been a long-term resident in total care.    Planned Discharge Destination: Skilled nursing facility    Time spent: 25 minutes  Author: Pennie Banter, DO 09/17/2021 4:08 PM  For on call review [email or web address].

## 2021-09-17 NOTE — Progress Notes (Signed)
OT Screen Note  Patient Details Name: [name] [name] MRN: [id number] DOB: 27-Jun-1945   Cancelled Treatment:    Reason Eval/Treat Not Completed: OT screened, no needs identified, will sign off. Spoke with care team. Rehab supervisor confirmed pt baseline is dependent/total care for all ADL, hoyer lift. LTC appropriate. Not rehab appropriate. Will sign off. Care team aware.  Arman Filter., MPH, MS, OTR/L ascom [phone] 09/17/21, 12:22 PM

## 2021-09-17 NOTE — TOC Progression Note (Signed)
Transition of Care [facility]) - Progression Note    Patient Details  Name: [name] [name] MRN: [id number] Date of Birth: [birth year]  Transition of Care [facility]) CM/SW Contact  Dailey Cellar, RN Phone Number: 09/17/2021, 12:32 PM  Clinical Narrative:    Sherron Monday to [facility] @ [facility] confirmed [facility] is willing to accept patient back. Patient is LTC resident at facility.         Expected Discharge Plan and Services                                                 Social Determinants of Health (SDOH) Interventions    Readmission Risk Interventions     View : No data to display.

## 2021-09-17 NOTE — Care Management Important Message (Signed)
Important Message  Patient Details  Name: [name] [name] MRN: [id number] Date of Birth: Oct 21, 1945   Medicare Important Message Given:  Yes     Juliann Pulse A Rabecca Birge 09/17/2021, 11:00 AM

## 2021-09-17 NOTE — TOC Progression Note (Signed)
Transition of Care [facility]) - Progression Note    Patient Details  Name: [name] [name] MRN: [id number] Date of Birth: 1945-08-20  Transition of Care [facility]) CM/SW Contact  [name] Section, RN Phone Number: 09/17/2021, 3:39 PM  Clinical Narrative:   RNCM Left message for daughter [name] [name].  Spoke to daughter [name] [name], who states she does not mind [place] calling, but [name] [name] is the decision maker for her father.  [name] [name] states that although family did not wish for patient to return to [facility], they are willing to speak to the facility to potentially work out a plan for return to facility if amenable.  Daughter will call back once they discuss return with facility.   [name],[name] Daughter [phone]   [phone]     [name], [name] Daughter [phone]   [phone]           Expected Discharge Plan and Services                                                 Social Determinants of Health (SDOH) Interventions    Readmission Risk Interventions     View : No data to display.

## 2021-09-18 ENCOUNTER — Encounter: Payer: Self-pay | Admitting: Internal Medicine

## 2021-09-18 DIAGNOSIS — R6520 Severe sepsis without septic shock: Secondary | ICD-10-CM | POA: Diagnosis not present

## 2021-09-18 DIAGNOSIS — A419 Sepsis, unspecified organism: Secondary | ICD-10-CM | POA: Diagnosis not present

## 2021-09-18 LAB — CREATININE, SERUM
Creatinine, Ser: 0.6 mg/dL — ABNORMAL LOW (ref 0.61–1.24)
GFR, Estimated: >60 mL/min (ref >60)

## 2021-09-18 MED ORDER — CHLORHEXIDINE GLUCONATE 0.12 % MT SOLN: 15.0000 mL | Freq: Two times a day (BID) | OROMUCOSAL | 0 refills | Status: DC

## 2021-09-18 MED ORDER — ORAL CARE MOUTH RINSE
15.0000 mL | Freq: Two times a day (BID) | OROMUCOSAL | 0 refills | Status: DC
Start: 2021-09-18 — End: 2023-08-30

## 2021-09-18 MED ORDER — ENSURE ENLIVE PO LIQD: 237.0000 mL | Freq: Three times a day (TID) | ORAL | 12 refills | Status: DC

## 2021-09-18 MED ORDER — PREDNISONE 10 MG PO TABS: ORAL_TABLET | ORAL | 0 refills | Status: AC

## 2021-09-18 MED ORDER — NEOMYCIN-POLYMYXIN-DEXAMETH 3.5-10000-0.1 OP SUSP: 2.0000 [drp] | OPHTHALMIC | 0 refills | Status: DC

## 2021-09-18 NOTE — TOC Progression Note (Signed)
Transition of Care [facility]) - Progression Note    Patient Details  Name: Stratton Villwock MRN: [id number] Date of Birth: 19-Jan-1946  Transition of Care [facility]) CM/SW Contact  [place] Cellar, RN Phone Number: 09/18/2021, 10:42 AM  Clinical Narrative:    Spoke with Tonya @ [facility] who reports she has not spoken to daughter regarding patient not returning but she will call her now and get clarification.         Expected Discharge Plan and Services                                                 Social Determinants of Health (SDOH) Interventions    Readmission Risk Interventions     View : No data to display.
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
## 2021-09-18 NOTE — TOC Progression Note (Addendum)
Transition of Care [facility]) - Progression Note    Patient Details  Name: [name] [name] MRN: [id number] Date of Birth: [birth year]  Transition of Care [facility]) CM/SW [place], RN Phone Number: 09/18/2021, 1:43 PM  Clinical Narrative:    Adams,Gwendolyn Daughter [phone]   [phone]     Doss, Mccormack Daughter [phone]   [phone]      RNCM spoke with both daughters.  They stated they will speak with [facility] care, but since patient has no other bed offers, they are consenting for patient to return to [facility].  Daughters asked questions regarding appeal of discharge.  Appeal information provided to daughters, who stated they are consenting for patient to return to [facility] at this time.     Addendum 1352 as per tonya patient can come back today  ems called   Expected Discharge Plan and Services                                                 Social Determinants of Health (SDOH) Interventions    Readmission Risk Interventions     View : No data to display.

## 2021-11-06 ENCOUNTER — Non-Acute Institutional Stay: Payer: Medicare Other | Admitting: Nurse Practitioner

## 2021-11-06 DIAGNOSIS — R630 Anorexia: Secondary | ICD-10-CM

## 2021-11-06 DIAGNOSIS — Z515 Encounter for palliative care: Secondary | ICD-10-CM

## 2021-11-06 DIAGNOSIS — E43 Unspecified severe protein-calorie malnutrition: Secondary | ICD-10-CM

## 2021-11-06 DIAGNOSIS — F1027 Alcohol dependence with alcohol-induced persisting dementia: Secondary | ICD-10-CM

## 2021-11-07 ENCOUNTER — Encounter: Payer: Self-pay | Admitting: Nurse Practitioner

## 2021-11-07 NOTE — Progress Notes (Addendum)
[facility] Consult Note Telephone: [phone]  Fax: [phone]    Date of encounter: 11/07/21 9:40 AM PATIENT NAME: [name] [name] [place] [address]   [phone] (home)  DOB: Jun 19, 1945 MRN: [id number] PRIMARY CARE PROVIDER:    [facility] RESPONSIBLE PARTY:    Contact Information     Name Relation Home Work Mobile   [name] [name],[name] [name] Daughter [phone]  [phone]   [name] [name], [name] [name] [phone]  [phone]       I met face to face with patient in facility. Palliative Care was asked to follow this patient by consultation request of  [place] to address advance care planning and complex medical decision making. This is a follow up visit.                                  ASSESSMENT AND PLAN / RECOMMENDATIONS:  Symptom Management/Plan: 1. Advance Care Planning; Full code, call pending to family for further discussion of complex medical decision making, difficulty reaching family   2. Palliative care encounter; Palliative care encounter; Palliative medicine team will continue to support patient, patient's family, and medical team. Visit consisted of counseling and education dealing with the complex and emotionally intense issues of symptom management and palliative care in the setting of serious and potentially life-threatening illness   3. Anorexia; slight improvement; Continue to monitor weights, encourage to eat, nutrition, supplement, weights reviewed. Encourage to eat, discussions with family wishes full code, treatment;   04/22/2021 weight 113.4 lbs 05/21/2021 weight 113.8 lbs 07/02/2021 weight 107.2 lbs 07/22/2021 weight 113 lbs 10/26/2021 weight 104.1 lbs 9/7 lbs/6 months; 8.52%; has not >10% loss 4. Dementia progressive slow decline, stable at present time; continue supportive measure, ongoing discussions of goc with family.    Follow up Palliative Care Visit:  Palliative care will continue to follow for complex medical decision making, advance care planning, and clarification of goals. Return 4 weeks or prn.   I spent 47 minutes providing this consultation at 11:15am. More than 50% of the time in this consultation was spent in counseling and care coordination. PPS: 30% Chief Complaint: Follow up palliative consult for complex medical decision making   HISTORY OF PRESENT ILLNESS:  [name] [name] is a 76 y.o. year old male  with multiple medical problems including Dementia, hypertension, BPH, COPD, vitamin D deficiency, mood disorder, anxiety, history of covid. [name] [name] resides at [name] [name] [name] is total care for turning/positioning, bathing, dressing, toileting.[name] [name] does require assistance from the staff for feeding. [name] [name] does remain a full code. Hospitalization from 09/11/2021 to 09/18/2021 for acute exacerbation with COPD with acute respiratory failure with hypoxia initially requiring bipap weaned to r/a on d/c, acute metabolic encephalopathy, AKI prerenal azotemia from sepsis, improved with IVF, severe protein calorie malnutrition, severe sepsis source likely aspiration pna, UTI e.coli, left eye infection tx with abx gtts; resolved then d/c back to [facility] where he resides LTC.   Staff endorses no new changes or concerns.  At present, [name] [name] is lying in bed, appears comfortable, sleeping. [name] [name] awoke to verbal commands, makes eye contact, says few clear words. No visitors present. No meaningful discussion with cognitive impairment. Medical goals reviewed. No new changes recommended. [name] [name] appears to be stable today. I have contact daughter [name] [name], talked about PC visit, update given, medical goals, poc  reviewed, no changes. Updated staff.   History obtained from review of EMR, discussion with facility staff and [name] [name], daughter [name] [name].  I reviewed  available labs, medications, imaging, studies and related documents from the EMR.  Records reviewed and summarized above.    ROS 10 point system reviewed with staff as [name] [name] is cognitively impaired   Physical Exam: Constitutional: NAD General: frail appearing, thin, debilitated, cognitively impaired male EYES:  lids intact ENMT: oral mucous membranes moist CV: S1S2, RRR Pulmonary: LCTA, no increased work of breathing, no cough, room air Abdomen: soft and non tender MSK: bed-bound; muscle wasting Skin: warm and dry Neuro:  + generalized weakness,  + cognitive impairment Psych: non-anxious affect, Alert, confused  Thank you for the opportunity to participate in the care of [name] [name].  The palliative care team will continue to follow. Please call our office at [phone] if we can be of additional assistance.   Anjelo Pullman Ihor Gully, NP

## 2021-12-28 ENCOUNTER — Non-Acute Institutional Stay: Payer: Medicare Other | Admitting: Nurse Practitioner

## 2021-12-28 ENCOUNTER — Encounter: Payer: Self-pay | Admitting: Nurse Practitioner

## 2021-12-28 VITALS — BP 109/72 | HR 72 | Temp 97.5°F | Resp 18 | Wt 104.0 lb

## 2021-12-28 DIAGNOSIS — F1027 Alcohol dependence with alcohol-induced persisting dementia: Secondary | ICD-10-CM

## 2021-12-28 DIAGNOSIS — R630 Anorexia: Secondary | ICD-10-CM

## 2021-12-28 DIAGNOSIS — Z515 Encounter for palliative care: Secondary | ICD-10-CM

## 2021-12-28 DIAGNOSIS — E43 Unspecified severe protein-calorie malnutrition: Secondary | ICD-10-CM

## 2021-12-28 NOTE — Progress Notes (Signed)
[facility] Consult Note Telephone: [phone]  Fax: [phone]    Date of encounter: 12/28/21 1:55 PM PATIENT NAME: [name] [name] [place] [address]   [phone] (home)  DOB: [birth year] MRN: [id number] PRIMARY CARE PROVIDER:    [facility]  RESPONSIBLE PARTY:    Contact Information     Name Relation Home Work Mobile   Adams,Gwendolyn Daughter [phone]  [phone]   Tarique, Loveall [phone]  [phone]     I met face to face with patient in facility. Palliative Care was asked to follow this patient by consultation request of  [facility] [place] to address advance care planning and complex medical decision making. This is a follow up visit.                                  ASSESSMENT AND PLAN / RECOMMENDATIONS:  Symptom Management/Plan: 1. Advance Care Planning; Full code, call pending to family for further discussion of complex medical decision making, difficulty reaching family   2. Palliative care encounter; Palliative care encounter; Palliative medicine team will continue to support patient, patient's family, and medical team. Visit consisted of counseling and education dealing with the complex and emotionally intense issues of symptom management and palliative care in the setting of serious and potentially life-threatening illness   3. Anorexia/protein calorie malnutrition; ongoing; Continue to monitor weights, encourage to eat, nutrition, supplement, weights reviewed. Encourage to eat, pending discussions with family about goc, if >10% weight loss maybe option for hospice if goals align with family as currently mr. Chuong is a full code, full aggressive interventions   04/22/2021 weight 113.4 lbs 05/21/2021 weight 113.8 lbs 07/02/2021 weight 107.2 lbs 07/22/2021 weight 113 lbs 12/22/2021 weight 104 lbs 9.8 lbs/7 months; 8.61 % loss  4. Dementia progressive slow decline,  stable at present time; continue supportive measure, ongoing discussions of goc with family.    Follow up Palliative Care Visit: Palliative care will continue to follow for complex medical decision making, advance care planning, and clarification of goals. Return 8 weeks or prn.   I spent 35 minutes providing this consultation starting at 1:45 pm. More than 50% of the time in this consultation was spent in counseling and care coordination.   PPS: 30%   Chief Complaint: Follow up palliative consult for complex medical decision making   HISTORY OF PRESENT ILLNESS:  [name] [name] is a 76 y.o. year old male  with multiple medical problems including Dementia, hypertension, BPH, COPD, vitamin D deficiency, mood disorder, anxiety, history of covid. [name] [name] resides at [place] at [facility]. [name] [name] is total care for turning/positioning, bathing, dressing, toileting.[name] [name] does require assistance from the staff for feeding. Currently on a regular diet, regular texture, regular liquids consistency with med plus supplement with weight loss. No recent wound, hospitalizations, falls. [name] [name] does remain a full code. Staff endorses no new changes or concerns. ROS done with staff. At present, [name] [name] is lying in bed, appears comfortable, sleeping. [name] [name] awoke to verbal commands, makes eye contact, says few clear words. No visitors present. No meaningful discussion with cognitive impairment. Most of PC visit with Mr Roza supportive; remains asymptomatic, medical goals reviewed. No new changes recommended. [name] [name] appears to be stable today. I have attempted to reach family, unable to leave a message; very difficulty. I updated staff, no  new changes recommended today .      History obtained from review of EMR, discussion with facility staff and Mr. Relph.  I reviewed available labs, medications, imaging, studies and related documents  from the EMR.  Records reviewed and summarized above.    ROS 10 point system reviewed with staff as [name] is cognitively impaired   Physical Exam: Constitutional: NAD General: frail appearing, thin, debilitated, cognitively impaired male EYES:  lids intact ENMT: oral mucous membranes moist CV: S1S2, RRR Pulmonary: LCTA, room air Abdomen: normo-active BS + 4 quadrants, soft and non tender MSK: bed-bound; muscle wasting Skin: warm and dry Neuro:  + generalized weakness,  + cognitive impairment Psych: non-anxious affect, A and Oriented to self, clear words  Thank you for the opportunity to participate in the care of [name] [name].  The palliative care team will continue to follow. Please call our office at [phone] if we can be of additional assistance.   Brenley Priore Ihor Gully, NP

## 2022-02-15 ENCOUNTER — Encounter: Payer: Self-pay | Admitting: Nurse Practitioner

## 2022-02-15 ENCOUNTER — Non-Acute Institutional Stay: Payer: Medicare Other | Admitting: Nurse Practitioner

## 2022-02-15 VITALS — BP 109/72 | HR 91 | Temp 97.5°F | Resp 20 | Wt 103.0 lb

## 2022-02-15 DIAGNOSIS — R630 Anorexia: Secondary | ICD-10-CM

## 2022-02-15 DIAGNOSIS — E43 Unspecified severe protein-calorie malnutrition: Secondary | ICD-10-CM

## 2022-02-15 DIAGNOSIS — Z515 Encounter for palliative care: Secondary | ICD-10-CM

## 2022-02-15 DIAGNOSIS — F1027 Alcohol dependence with alcohol-induced persisting dementia: Secondary | ICD-10-CM

## 2022-02-15 NOTE — Progress Notes (Signed)
[facility] Consult Note Telephone: [phone]  Fax: [phone]    Date of encounter: 02/15/22 4:07 PM PATIENT NAME: [name] [name] [id number]   [phone] (home)  DOB: 01/08/1946 MRN: [id number] PRIMARY CARE PROVIDER:    [facility]  RESPONSIBLE PARTY:    Contact Information     Name Relation Home Work Mobile   [name] [name] Daughter [phone]  [phone]   [name] [name], [name] [name] [phone]  [phone]        I met face to face with patient in facility. Palliative Care was asked to follow this patient by consultation request of  [name] [name] to address advance care planning and complex medical decision making. This is a follow up visit.                                  ASSESSMENT AND PLAN / RECOMMENDATIONS:  Symptom Management/Plan: 1. Advance Care Planning; DNR   2. Palliative care encounter; Palliative care encounter; Palliative medicine team will continue to support patient, patient's family, and medical team. Visit consisted of counseling and education dealing with the complex and emotionally intense issues of symptom management and palliative care in the setting of serious and potentially life-threatening illness   3. Anorexia/protein calorie malnutrition; ongoing; Continue to monitor weights, encourage to eat, nutrition, supplement, weights reviewed. 04/22/2021 weight 113.4 lbs 05/21/2021 weight 113.8 lbs 07/02/2021 weight 107.2 lbs 07/22/2021 weight 113 lbs 12/22/2021 weight 104 lbs 01/21/2022 weight 103 lbs 4. Dementia progressive slow decline, stable at present time; continue supportive measure, ongoing discussions of goc with family.    Follow up Palliative Care Visit: Palliative care will continue to follow for complex medical decision making, advance care planning, and clarification of goals. Return 4 weeks or prn.   I spent 37 minutes providing this  consultation starting at 1:45 pm. More than 50% of the time in this consultation was spent in counseling and care coordination.   PPS: 30%   Chief Complaint: Follow up palliative consult for complex medical decision making   HISTORY OF PRESENT ILLNESS:  [name] [name] is a 76 y.o. year old male  with multiple medical problems including Dementia, hypertension, BPH, COPD, vitamin D deficiency, mood disorder, anxiety, history of covid. [name] [name] resides at [place] at [facility]. [name] [name] is total care for turning/positioning, bathing, dressing, toileting.[name] [name] does require assistance from the staff for feeding. Appetite varies depending on what is being served. He did loose 1 lb this month. Staff has been getting [name] [name] to w/c to sit at the nurses station for mobility, activities. No other concerns. At present [name] [name] is lying in bed, arguing with his room-mate. [name] [name] did stop arguing with his room-mate when approached, asked how he has been feeling. [name] [name] endorses "I'm good", then intermit word salad with clear words complaining about his room mate, making eye contact. [name] [name] was difficult to stay on a topic with cognitive impairment. [name] [name] was cooperative with assessment, support provided. Medical goals reviewed. No new changes recommended, updated staff, will continue to follow for weight loss, progression of dementia.    History obtained from review of EMR, discussion with facility staff and [name] [name].  I reviewed available labs, medications, imaging, studies and related documents from the EMR.  Records reviewed and summarized above.    ROS 10 point  system reviewed with staff as [name] [name] is cognitively impaired   Physical Exam: Constitutional: NAD General: frail appearing, thin, debilitated, cognitively impaired male EYES:  lids intact ENMT: oral mucous membranes moist CV: S1S2, RRR Pulmonary:  LCTA, room air Abdomen: normo-active BS + 4 quadrants, soft and non tender MSK: bed-bound; muscle wasting Skin: warm and dry Neuro:  + generalized weakness,  + cognitive impairment Psych: non-anxious affect, A and Oriented to self, clear words   Thank you for the opportunity to participate in the care of [name] [name].  The palliative care team will continue to follow. Please call our office at [phone] if we can be of additional assistance.   [name] Scaduto Ihor Gully, NP

## 2022-04-23 ENCOUNTER — Non-Acute Institutional Stay: Payer: Medicare Other | Admitting: Nurse Practitioner

## 2022-04-23 VITALS — BP 143/80 | HR 78 | Temp 96.9°F | Resp 18 | Wt 103.3 lb

## 2022-04-23 DIAGNOSIS — F1027 Alcohol dependence with alcohol-induced persisting dementia: Secondary | ICD-10-CM

## 2022-04-23 DIAGNOSIS — E43 Unspecified severe protein-calorie malnutrition: Secondary | ICD-10-CM

## 2022-04-23 DIAGNOSIS — Z515 Encounter for palliative care: Secondary | ICD-10-CM

## 2022-04-23 DIAGNOSIS — R630 Anorexia: Secondary | ICD-10-CM

## 2022-04-24 ENCOUNTER — Encounter: Payer: Self-pay | Admitting: Nurse Practitioner

## 2022-04-24 NOTE — Progress Notes (Signed)
[facility] Consult Note Telephone: [phone]  Fax: [phone]    Date of encounter: 04/24/22 1:41 PM PATIENT NAME: [name] [name] [address] [place] [address]   [phone] (home)  DOB: 02/28/1946 MRN: [id number] PRIMARY CARE PROVIDER:    [facility]  RESPONSIBLE PARTY:    Contact Information     Name Relation Home Work Mobile   Adams,Gwendolyn Daughter [phone]  [phone]   Burnett, Spray [phone]  [phone]        I met face to face with patient in facility. Palliative Care was asked to follow this patient by consultation request of  [facility] to address advance care planning and complex medical decision making. This is a follow up visit.                                  ASSESSMENT AND PLAN / RECOMMENDATIONS:  Symptom Management/Plan: 1. Advance Care Planning; DNR   2. Palliative care encounter; Palliative care encounter; Palliative medicine team will continue to support patient, patient's family, and medical team. Visit consisted of counseling and education dealing with the complex and emotionally intense issues of symptom management and palliative care in the setting of serious and potentially life-threatening illness   3. Anorexia/protein calorie malnutrition; ongoing; Continue to monitor weights, encourage to eat, nutrition, supplement, weights reviewed, continue to weight. More assistance with feeding. 04/22/2021 weight 113.4 lbs 05/21/2021 weight 113.8 lbs 07/02/2021 weight 107.2 lbs 07/22/2021 weight 113 lbs 12/22/2021 weight 104 lbs 01/21/2022 weight 103 lbs 03/25/2022 weight 103.3 lbs 4. Dementia progressive slow decline, stable at present time; continue supportive measure, ongoing discussions of goc with family.    Follow up Palliative Care Visit: Palliative care will continue to follow for complex medical decision making, advance care planning, and clarification  of goals. Return 4 to 8 weeks or prn.   I spent 45 minutes providing this consultation. More than 50% of the time in this consultation was spent in counseling and care coordination.   PPS: 30%   Chief Complaint: Follow up palliative consult for complex medical decision making   HISTORY OF PRESENT ILLNESS:  [name] [name] is a 77 y.o. year old male  with multiple medical problems including Dementia, hypertension, BPH, COPD, vitamin D deficiency, mood disorder, anxiety, history of covid. [facility]. Messman [facility]. Mr. Knoll is total care for turning/positioning, bathing, dressing, toileting.Mr. Hoffert does require assistance from the staff for feeding with varied appetite. No recent hospitalizations, wounds, falls per staff. At present Mr Swider is lying in bed, moved to another room with different room-mate. Mr Lehenbauer does make eye contact, few clear words mixed with word salad. Mr Mattie was cooperative with assessment, no meaningful discussion with cognitive impairment. Support provided. Mr Pullara is currently stable, no further cognitive or functional delcine. Mr. Mcpheeters was cooperative with assessment, support provided. Medical goals, medications, poc reviewed. No new changes recommended, updated staff, will continue to follow for weight loss, progression of dementia. Attempted to contact dtg for update on pc visit   History obtained from review of EMR, discussion with facility staff and Mr. Slape.  I reviewed available labs, medications, imaging, studies and related documents from the EMR.  Records reviewed and summarized above.  Physical Exam: Constitutional: NAD General: frail appearing, cognitively impaired male EYES:  lids intact ENMT: oral mucous membranes moist  CV: S1S2, RRR Pulmonary: LCTA MSK: bed-bound; muscle wasting Skin: warm and dry Neuro:  + generalized weakness,  + cognitive impairment Psych:  non-anxious affect, A and Oriented to self, mumbling most words  Thank you for the opportunity to participate in the care of Mr. Harrower. Please call our office at [phone] if we can be of additional assistance.   Neiva Maenza Ihor Gully, NP

## 2022-05-06 ENCOUNTER — Non-Acute Institutional Stay: Payer: Medicare Other | Admitting: Nurse Practitioner

## 2022-05-06 ENCOUNTER — Encounter: Payer: Self-pay | Admitting: Nurse Practitioner

## 2022-05-06 VITALS — BP 143/80 | HR 74 | Temp 96.9°F | Resp 16 | Wt 100.0 lb

## 2022-05-06 DIAGNOSIS — F1027 Alcohol dependence with alcohol-induced persisting dementia: Secondary | ICD-10-CM

## 2022-05-06 DIAGNOSIS — R630 Anorexia: Secondary | ICD-10-CM

## 2022-05-06 DIAGNOSIS — E43 Unspecified severe protein-calorie malnutrition: Secondary | ICD-10-CM

## 2022-05-06 DIAGNOSIS — Z515 Encounter for palliative care: Secondary | ICD-10-CM

## 2022-05-06 NOTE — Progress Notes (Signed)
[facility] Consult Note Telephone: [phone]  Fax: [phone]    Date of encounter: 05/06/22 1:54 PM PATIENT NAME: [name] [name] [name] [address]   [phone] (home)  DOB: 12-31-1945 MRN: [id number] PRIMARY CARE PROVIDER:    [facility]  RESPONSIBLE PARTY:    Contact Information     Name Relation Home Work Mobile   Adams,Gwendolyn Daughter [phone]  [phone]   Malon, Branton [phone]  [phone]      I met face to face with patient and family in [facility] home/facility. Palliative Care was asked to follow this patient by consultation request of  [facility] to address advance care planning and complex medical decision making. This is a follow up visit. ASSESSMENT AND PLAN / RECOMMENDATIONS:  Symptom Management/Plan: Palliative care encounter; Palliative care encounter; Palliative medicine team will continue to support patient, patient's family, and medical team. Visit consisted of counseling and education dealing with the complex and emotionally intense issues of symptom management and palliative care in the setting of serious and potentially life-threatening illness Anorexia/protein calorie malnutrition; ongoing; Continue to monitor weights, encourage to eat, nutrition, supplement, weights reviewed, continue to weight. More assistance with feeding. 04/22/2021 weight 113.4 lbs 05/21/2021 weight 113.8 lbs 07/02/2021 weight 107.2 lbs 07/22/2021 weight 113 lbs 12/22/2021 weight 104 lbs 01/21/2022 weight 103 lbs 03/25/2022 weight 103.3 lbs 05/04/22 weight 100 lbs  12 months 13.4 lbs -11.82% 3 months 3 lbs - 2.91% 3.   Dementia progressive slow decline, stable at present time; continue supportive  measures, ongoing discussions of goc with family.   Follow up Palliative Care Visit: Palliative care will continue to follow for complex medical decision making,  advance care planning, and clarification of goals. Return 4-8 weeks or prn.  I spent 46 minutes providing this consultation. More than 50% of the time in this consultation was spent in counseling and care coordination.  PPS: 40%  Chief Complaint: Patient seen today for weight check and follow up due to recent weight loss.  HISTORY OF PRESENT ILLNESS:  Danzel Marszalek is a 77 y.o. year old male  with with multiple medical problems including Dementia, hypertension, BPH, COPD, vitamin D deficiency, mood disorder, anxiety, history of covid. Mr. Aiello resides at [place] [facility]. Mr. Culpepper is total care for turning/positioning, bathing, dressing, toileting.Mr. Rudnick does require assistance from the staff for feeding with varied appetite.  He is seen today for weight check due to 11.9% weight loss over last 12 months. No recent hospitalizations (Since 6/23 where he was treated for aspiration pneumonia), wounds, falls per staff.  At present Mr Pitta is seated in dining room with other residents preparing to eat lunch. He make eye contact, smiles and speaks with few clear words mixed with word salad. Mr Graffam was cooperative with assessment pleasant and smiling, however, no meaningful discussion with cognitive impairment. Support provided, lengthy visit with support.  Mr Reetz is currently stable.  Mr. Minter was cooperative with assessment, support provided. Though he has lost 11.9% in last year his weight loss has stabilized over last three months and he has only lost 2.9% since October 2023. No new changes recommended, updated staff, will continue to follow for weight loss, progression of dementia. Attempted to contact dtg for update on pc visit. Medical goals, medications, poc reviewed, continue current poc, encourage to eat, supplements. Will continue to monitor weights, appetite, functional and cognitive disease progression,  overall decline  with if looses 10% weight <3 to 6 months possible option of hospice if family open to medicare benefit. Updated staff.    History obtained from review of EMR, discussion with primary team, and interview with family, facility staff/caregiver and/or Mr. Marchitto.  I reviewed available labs, medications, imaging, studies and related documents from the EMR.  Records reviewed and summarized above.   Physical Exam: Constitutional: NAD General: frail appearing, thin male sitting in wheelchair at dining room table with other residents ENMT: poor dentition, missing several teeth CV: S1S2, RRR Pulmonary: LCTA, no increased work of breathing, no cough, room air Skin: warm and dry, no rashes or wounds on visible skin Neuro:  significant cognitive impairment, makes eye contact, smiles, only mumbles word salad, unable to answer pointed questions Psych: cooperative with exam, appears non- anxious, calm Thank you for the opportunity to participate in the care of Mr. Nazar. Please call our office at [phone] if we can be of additional assistance.   Tariya Morrissette Ihor Gully, NP

## 2022-06-14 ENCOUNTER — Non-Acute Institutional Stay: Payer: Medicare Other | Admitting: Nurse Practitioner

## 2022-06-14 ENCOUNTER — Encounter: Payer: Self-pay | Admitting: Nurse Practitioner

## 2022-06-14 VITALS — BP 143/80 | HR 74 | Temp 96.9°F | Resp 18 | Wt 96.9 lb

## 2022-06-14 DIAGNOSIS — F1027 Alcohol dependence with alcohol-induced persisting dementia: Secondary | ICD-10-CM

## 2022-06-14 DIAGNOSIS — R630 Anorexia: Secondary | ICD-10-CM

## 2022-06-14 DIAGNOSIS — E43 Unspecified severe protein-calorie malnutrition: Secondary | ICD-10-CM

## 2022-06-14 DIAGNOSIS — Z515 Encounter for palliative care: Secondary | ICD-10-CM

## 2022-06-14 NOTE — Progress Notes (Signed)
    [facility] Consult Note Telephone: [phone]  Fax: [phone]    Date of encounter: 06/14/22 1:03 PM PATIENT NAME: Manrique Pickler [place]  [address]   [phone] (home)  DOB: 1946/03/04 MRN: [id number] PRIMARY CARE PROVIDER:    [facility]  RESPONSIBLE PARTY:    Contact Information     Name Relation Home Work Mobile   Adams,Gwendolyn Daughter [phone]  [phone]   Dai, Duhn [phone]  [phone]     I met face to face with patient and family in [facility] home/facility. Palliative Care was asked to follow this patient by consultation request of  [facility] to address advance care planning and complex medical decision making. This is a follow up visit. ASSESSMENT AND PLAN / RECOMMENDATIONS:  Symptom Management/Plan: Palliative care encounter; Palliative care encounter; Palliative medicine team will continue to support patient, patient's family, and medical team. Visit consisted of counseling and education dealing with the complex and emotionally intense issues of symptom management and palliative care in the setting of serious and potentially life-threatening illness Anorexia/protein calorie malnutrition; ongoing; Continue to monitor weights, encourage to eat, nutrition, supplement, weights reviewed, continue to weight. More assistance with feeding. 01/21/2022 weight 103 lbs 03/25/2022 weight 103.3 lbs 05/04/22 weight 100 lbs  05/26/2022 weight 96.5 lbs 6.5 lbs/4 months 3.   Dementia progressive slow decline, stable at present time; continue supportive  measures   Follow up Palliative Care Visit: Palliative care will continue to follow for complex medical decision making, advance care planning, and clarification of goals. Return 4-8 weeks or prn.   I spent 45 minutes providing this consultation starting at 12:15 PM. More than 50% of the time in this  consultation was spent in counseling and care coordination.   PPS: 40%   Chief Complaint: Patient seen today for weight check and follow up due to recent weight loss.   HISTORY OF PRESENT ILLNESS:  Carlie Franchina is a 77 y.o. year old male  with with multiple medical problems including Dementia, hypertension, BPH, COPD, vitamin D deficiency, mood disorder, anxiety, history of covid. Mr. Dalpiaz resides at [place] at [facility] Mr. Calahan is total care for turning/positioning, bathing, dressing, toileting.Mr. Disanti does require assistance from the staff for feeding with varied appetite.  Purpose of today PC f/u visit further discussion monitor trends of appetite, weights, monitor for functional, cognitive decline with chronic disease progression, assess any active symptoms, supportive role.I visited and observed Mr Samson, he was lying in bed, appears debilitated, makes eye contact. Mr Marcus speech is garbled, more word salad. Reviewed weights noted loss. Attempted to contact dtg. No meaningful discussion with cognitive impairment. Mr Smalls does attempt to engage, support provided. Support provided. Medications, poc, goc reviewed. Updated staff, continue to encourage Mr Waye to eat.    History obtained from review of EMR, discussion with primary team, and interview with family, facility staff/caregiver and/or Mr. Callo.  I reviewed available labs, medications, imaging, studies and related documents from the EMR.  Records reviewed and summarized above.    Physical Exam: General: frail appearing, thin male  CV: S1S2, RRR Pulmonary: LCTA Neuro:  +generalize dweakness Psych: oriented to self Thank you for the opportunity to participate in the care of Mr. Cuervo. Please call our office at [phone] if we can be of additional assistance.   Wasyl Dornfeld Ihor Gully, NP

## 2022-07-28 ENCOUNTER — Non-Acute Institutional Stay: Payer: Medicare Other | Admitting: Nurse Practitioner

## 2022-07-28 ENCOUNTER — Encounter: Payer: Self-pay | Admitting: Nurse Practitioner

## 2022-07-28 VITALS — BP 136/72 | HR 64 | Temp 98.3°F | Resp 18 | Wt 99.0 lb

## 2022-07-28 DIAGNOSIS — F1027 Alcohol dependence with alcohol-induced persisting dementia: Secondary | ICD-10-CM

## 2022-07-28 DIAGNOSIS — Z515 Encounter for palliative care: Secondary | ICD-10-CM

## 2022-07-28 DIAGNOSIS — E43 Unspecified severe protein-calorie malnutrition: Secondary | ICD-10-CM

## 2022-07-28 NOTE — Progress Notes (Signed)
Therapist, nutritional Palliative Care Consult Note Telephone: [phone]  Fax: [phone]    Date of encounter: 07/28/22 4:31 PM PATIENT NAME: [name] [name] [address] [place] [address]   [phone] (home)  DOB: March 17, 1946 MRN: [id number] PRIMARY CARE PROVIDER:    [facility]  RESPONSIBLE PARTY:    Contact Information     Name Relation Home Work Mobile   [name],[name] Daughter [phone]  [phone]   [name], [name] [phone]  [phone]        I met face to face with patient and family in [facility] home/facility. Palliative Care was asked to follow this patient by consultation request of  [facility] to address advance care planning and complex medical decision making. This is a follow up visit. ASSESSMENT AND PLAN / RECOMMENDATIONS:  Symptom Management/Plan: ACP: DNR 2. Palliative care encounter; Palliative care encounter; Palliative medicine team will continue to support patient, patient's family, and medical team. Visit consisted of counseling and education dealing with the complex and emotionally intense issues of symptom management and palliative care in the setting of serious and potentially life-threatening illness 3. Anorexia/protein calorie malnutrition; ongoing, with weight gain; Continue to monitor weights, encourage to eat, nutrition, supplement, weights reviewed, continue to weight. More assistance with feeding. 01/21/2022 weight 103 lbs 03/25/2022 weight 103.3 lbs 05/04/22 weight 100 lbs  05/26/2022 weight 96.5 lbs 06/25/2022 weight 99 lbs 4.   Dementia progressive slow decline, stable at present time; continue supportive  measures   Follow up Palliative Care Visit: Palliative care will continue to follow for complex medical decision making, advance care planning, and clarification of goals. Return 4-8 weeks or prn.   I spent 46 minutes providing this consultation starting  at 12:24 pm. More than 50% of the time in this consultation was spent in counseling and care coordination.   PPS: 40%   Chief Complaint: Patient seen today for weight check and follow up due to recent weight loss.   HISTORY OF PRESENT ILLNESS:  [name] [name] is a 77 y.o. year old male  with with multiple medical problems including Dementia, hypertension, BPH, COPD, vitamin D deficiency, mood disorder, anxiety, history of covid. [name] [name] resides at [facility] at [facility]. [name] [name] is total care for turning/positioning, bathing, dressing, toileting.[name] [name] does require assistance from the staff for feeding with varied appetite.  Purpose of today PC f/u visit further discussion monitor trends of appetite, weights, monitor for functional, cognitive decline with chronic disease progression, assess any active symptoms, supportive role.I visited and observed [name], he was sitting in the w/c in the hall, makes eye contact, engaging, mumbling words difficult to understand, smiling, cooperative. No meaningful discussions with cognitive impairment. Emotional support provided, weight gain with weight review. Continue to encourage [name] to be oob, at nsg station, dining areas for meals, activities for socialization. Medical goals, poc, medications reviewed; provider and nursing notes reviewed. PC f/u visit further discussion monitor trends of appetite, weights, monitor for functional, cognitive decline with chronic disease progression, assess any active symptoms, supportive role. Attempted to reach dtg for update on pc visit.    History obtained from review of EMR, discussion with primary team, and interview with family, facility staff/caregiver and/or [name] [name].  I reviewed available labs, medications, imaging, studies and related documents from the EMR.  Records reviewed and summarized above.    Physical Exam: General: frail appearing, thin male   CV: S1S2, RRR  Pulmonary: breath sounds clear Neuro:  +generalize dweakness Psych: oriented to self Thank you for the opportunity to participate in the care of [name] [name]. Please call our office at [phone] if we can be of additional assistance.   Julieanna Geraci Prince Rome, NP

## 2022-08-17 ENCOUNTER — Non-Acute Institutional Stay: Payer: Medicare Other | Admitting: Nurse Practitioner

## 2022-08-17 VITALS — BP 97/70 | HR 82 | Temp 97.5°F | Resp 18 | Wt 106.0 lb

## 2022-08-17 DIAGNOSIS — E43 Unspecified severe protein-calorie malnutrition: Secondary | ICD-10-CM

## 2022-08-17 DIAGNOSIS — Z515 Encounter for palliative care: Secondary | ICD-10-CM

## 2022-08-17 DIAGNOSIS — F1027 Alcohol dependence with alcohol-induced persisting dementia: Secondary | ICD-10-CM

## 2022-08-17 NOTE — Progress Notes (Signed)
Therapist, nutritional Palliative Care Consult Note Telephone: [phone]  Fax: [phone]    Date of encounter: 08/17/22 12:02 PM PATIENT NAME: [name] [name] [address] [place] [address]   [phone] (home)  DOB: 09-25-1945 MRN: [id number] PRIMARY CARE PROVIDER:    [facility]  RESPONSIBLE PARTY:    Contact Information     Name Relation Home Work Mobile   Adams,Gwendolyn Daughter [phone]  [phone]   Jiovani, Mccammon [phone]  [phone]     I met face to face with patient and family in [facility] home/facility. Palliative Care was asked to follow this patient by consultation request of  [facility] to address advance care planning and complex medical decision making. This is a follow up visit. ASSESSMENT AND PLAN / RECOMMENDATIONS:  Symptom Management/Plan: ACP: DNR 2. Palliative care encounter; Palliative care encounter; Palliative medicine team will continue to support patient, patient's family, and medical team. Visit consisted of counseling and education dealing with the complex and emotionally intense issues of symptom management and palliative care in the setting of serious and potentially life-threatening illness 3. Anorexia/protein calorie malnutrition; ongoing, with weight gain; Continue to monitor weights, encourage to eat, nutrition, supplement, weights reviewed, continue to weight. More assistance with feeding. 01/21/2022 weight 103 lbs 03/25/2022 weight 103.3 lbs 05/04/22 weight 100 lbs  05/26/2022 weight 96.5 lbs 06/25/2022 weight 99 lbs 07/30/2022 weight 106 lbs 4.   Dementia progressive slow decline, stable at present time; continue supportive  measures   Follow up Palliative Care Visit: Palliative care will continue to follow for complex medical decision making, advance care planning, and clarification of goals. Return 4-8 weeks or prn.   I spent 45 minutes providing this  consultation starting at 12:24 pm. More than 50% of the time in this consultation was spent in counseling and care coordination.   PPS: 40%   Chief Complaint: Patient seen today for weight check and follow up due to recent weight loss.   HISTORY OF PRESENT ILLNESS:  [name] [name] is a 77 y.o. year old male  with with multiple medical problems including Dementia, hypertension, BPH, COPD, vitamin D deficiency, mood disorder, anxiety, history of covid. [name] resides at [facility] at [facility]. [name] is total care for turning/positioning, bathing, dressing, toileting.[name] does require assistance from the staff for feeding with varied appetite.  Purpose of today PC f/u visit further discussion monitor trends of appetite, weights, monitor for functional, cognitive decline with chronic disease progression, assess any active symptoms, supportive role.I visited and observed [name], he was sitting in the w/c in the hall, currently with COPD exacerbation, on continuous O2.  Medical goals, poc, medications reviewed; provider and nursing notes reviewed. PC f/u visit further discussion monitor trends of appetite, weights, monitor for functional, cognitive decline with chronic disease progression, assess any active symptoms, supportive role. Attempted to reach dtg for update on pc visit.    History obtained from review of EMR, discussion with primary team, and interview with family, facility staff/caregiver and/or [name].  I reviewed available labs, medications, imaging, studies and related documents from the EMR.  Records reviewed and summarized above.    Physical Exam: General: frail appearing, thin male  CV: S1S2, RRR Pulmonary: breath sounds clear Neuro:  +generalize dweakness Psych: oriented to self  Thank you for the opportunity to participate in the care of [name]. Please call our office at [phone] if we can be  of  additional assistance.   Renold Kozar Prince Rome, NP

## 2022-08-25 ENCOUNTER — Encounter: Payer: Self-pay | Admitting: Nurse Practitioner

## 2022-08-25 ENCOUNTER — Non-Acute Institutional Stay: Payer: Medicare Other | Admitting: Nurse Practitioner

## 2022-08-25 VITALS — BP 97/70 | HR 82 | Temp 97.8°F | Resp 18 | Wt 104.7 lb

## 2022-08-25 DIAGNOSIS — E43 Unspecified severe protein-calorie malnutrition: Secondary | ICD-10-CM

## 2022-08-25 DIAGNOSIS — Z515 Encounter for palliative care: Secondary | ICD-10-CM

## 2022-08-25 DIAGNOSIS — F1027 Alcohol dependence with alcohol-induced persisting dementia: Secondary | ICD-10-CM

## 2022-08-25 DIAGNOSIS — R630 Anorexia: Secondary | ICD-10-CM

## 2022-08-25 NOTE — Progress Notes (Signed)
Therapist, nutritional Palliative Care Consult Note Telephone: [phone]  Fax: [phone]    Date of encounter: 08/25/22 5:15 PM PATIENT NAME: [name] [name] [address] [place] [address]   [phone] (home)  DOB: 05/10/1945 MRN: [id number] PRIMARY CARE PROVIDER:    [facility]  RESPONSIBLE PARTY:    Contact Information     Name Relation Home Work Mobile   Adams,[name] [name] Daughter [phone]  [phone]   [name], [name] [phone]  [phone]     I met face to face with patient and family in [facility] home/facility. Palliative Care was asked to follow this patient by consultation request of  [facility] to address advance care planning and complex medical decision making. This is a follow up visit. ASSESSMENT AND PLAN / RECOMMENDATIONS:  Symptom Management/Plan: ACP: DNR 2. Palliative care encounter; Palliative care encounter; Palliative medicine team will continue to support patient, patient's family, and medical team. Visit consisted of counseling and education dealing with the complex and emotionally intense issues of symptom management and palliative care in the setting of serious and potentially life-threatening illness 3. Anorexia/protein calorie malnutrition; ongoing, with weight gain; Continue to monitor weights, encourage to eat, nutrition, supplement, weights reviewed, continue to weight. More assistance with feeding. 01/21/2022 weight 103 lbs 03/25/2022 weight 103.3 lbs 05/04/22 weight 100 lbs  05/26/2022 weight 96.5 lbs 06/25/2022 weight 99 lbs 07/30/2022 weight 106 lbs 08/18/2022 weight 104.7 lbs 4.   Dementia progressive slow decline, stable at present time; continue supportive  measures   Follow up Palliative Care Visit: Palliative care will continue to follow for complex medical decision making, advance care planning, and clarification of goals. Return 2-8 weeks or prn.   I spent  46 minutes providing this consultation. More than 50% of the time in this consultation was spent in counseling and care coordination.   PPS: 40%   Chief Complaint: Patient seen today for weight check and follow up due to recent weight loss.   HISTORY OF PRESENT ILLNESS:  [name] [name] is a 77 y.o. year old male  with with multiple medical problems including Dementia, hypertension, BPH, COPD, vitamin D deficiency, mood disorder, anxiety, history of covid. [name] [name] resides at [facility] at [facility]. [name] [name] is total care for turning/positioning, bathing, dressing, toileting.[name] [name] does require assistance from the staff for feeding with varied appetite.  Purpose of today PC f/u visit further discussion monitor trends of appetite, weights, monitor for functional, cognitive decline with chronic disease progression, assess any active symptoms, supportive role. I visited and observed [name], he was sitting in the w/c in is room on continuous O2. [name] is able to say few clear words in-between jumbled words, talks fast at times. [name] was engaging, on O2, appears comfortable. [name] was cooperative with assessment. [name] was limited with cognitive impairment. COPD exacerbation has improved. Support provided. Attempted to contact dtg, reviewed [name] [name]; Medical goals, poc, medications reviewed; provider and nursing notes reviewed. PC f/u visit further discussion monitor trends of appetite, weights, monitor for functional, cognitive decline with chronic disease progression, assess any active symptoms, supportive role. Attempted to reach dtg for update on pc visit.    History obtained from review of EMR, discussion with primary team, and interview with family, facility staff/caregiver and/or Mr. Bredahl.  I reviewed available labs, medications, imaging, studies and related documents from the EMR.  Records reviewed and summarized  above.  Physical Exam: General: frail appearing, thin male  Neuro:  +generalize dweakness Psych: oriented to self Thank you for the opportunity to participate in the care of [name] [name]. Please call our office at [phone] if we can be of additional assistance.   Judene Logue Prince Rome, NP

## 2022-09-17 ENCOUNTER — Non-Acute Institutional Stay: Payer: Medicare Other | Admitting: Nurse Practitioner

## 2022-09-17 ENCOUNTER — Encounter: Payer: Self-pay | Admitting: Nurse Practitioner

## 2022-09-17 VITALS — BP 97/70 | HR 82 | Temp 97.6°F | Resp 18 | Wt 104.7 lb

## 2022-09-17 DIAGNOSIS — E43 Unspecified severe protein-calorie malnutrition: Secondary | ICD-10-CM

## 2022-09-17 DIAGNOSIS — F1027 Alcohol dependence with alcohol-induced persisting dementia: Secondary | ICD-10-CM

## 2022-09-17 DIAGNOSIS — Z515 Encounter for palliative care: Secondary | ICD-10-CM

## 2022-09-17 NOTE — Progress Notes (Signed)
Therapist, nutritional Palliative Care Consult Note Telephone: [phone]  Fax: [phone]    Date of encounter: 09/17/22 5:48 PM PATIENT NAME: [name] [name] [address] [place] [address]   [phone] (home)  DOB: [birth year] MRN: [id number] PRIMARY CARE PROVIDER:    [facility]  RESPONSIBLE PARTY:    Contact Information     Name Relation Home Work Mobile   Adams,Gwendolyn Daughter [phone]  [phone]   Brisco, Boleyn [phone]  [phone]     I met face to face with patient and family in [facility] home/facility. Palliative Care was asked to follow this patient by consultation request of  [facility] to address advance care planning and complex medical decision making. This is a follow up visit. ASSESSMENT AND PLAN / RECOMMENDATIONS:  Symptom Management/Plan: ACP: DNR 2. Palliative care encounter; Palliative care encounter; Palliative medicine team will continue to support patient, patient's family, and medical team. Visit consisted of counseling and education dealing with the complex and emotionally intense issues of symptom management and palliative care in the setting of serious and potentially life-threatening illness 3. Anorexia/protein calorie malnutrition; ongoing, with weight gain; Continue to monitor weights, encourage to eat, nutrition, supplement, weights reviewed, continue to weight. More assistance with feeding. 01/21/2022 weight 103 lbs 03/25/2022 weight 103.3 lbs 05/04/22 weight 100 lbs  05/26/2022 weight 96.5 lbs 06/25/2022 weight 99 lbs 07/30/2022 weight 106 lbs 08/18/2022 weight 104.7 lbs 4.   Dementia with COPD progressive slow decline, stable at present time; continue supportive  measures. Medications, goc, poc reviewed, attempted to contact dtg. [name] [name] has improved COPD exacerbation.    Follow up Palliative Care Visit: Palliative care will continue to follow for complex  medical decision making, advance care planning, and clarification of goals. Return 2-8 weeks or prn.   I spent 45 minutes providing this consultation. More than 50% of the time in this consultation was spent in counseling and care coordination.   PPS: 40%   Chief Complaint: Patient seen today for weight check and follow up due to recent weight loss.   HISTORY OF PRESENT ILLNESS:  [name] [name] is a 77 y.o. year old male  with with multiple medical problems including Dementia, hypertension, BPH, COPD, vitamin D deficiency, mood disorder, anxiety, history of covid. [name]. [name] resides at [facility] at [facility]. [name]. [name] is total care for turning/positioning, bathing, dressing, toileting.[name]. [name] does require assistance from the staff for feeding with varied appetite.  Purpose of today PC f/u visit further discussion monitor trends of appetite, weights, monitor for functional, cognitive decline with chronic disease progression, assess any active symptoms, supportive role. I visited and observed [name] [name], he was sitting in the chair in the dining area feeding himself lunch. [name] [name] did make eye contact, mumbles words with few clear words. [name] [name] was smiling, cooperative, enjoying his food. [name] [name] was able to feed himself intermit with prompting, encouragement. Support provided. Most PC visit supportive role, monitoring. Attempted to contact dtg, reviewed Gwendolyn; Medical goals, poc, medications reviewed; provider and nursing notes reviewed. PC f/u visit further discussion monitor trends of appetite, weights, monitor for functional, cognitive decline with chronic disease progression, assess any active symptoms, supportive role. Attempted to reach dtg for update on pc visit.    History obtained from review of EMR, discussion with primary team, and interview with family, facility staff/caregiver and/or [name]. [name].  I reviewed available labs,  medications, imaging, studies and related  documents from the EMR.  Records reviewed and summarized above.    Physical Exam: General: frail appearing, thin male  Pulm: Lungs clear Cardio: RRR Neuro:  +generalize dweakness Psych: oriented to self Thank you for the opportunity to participate in the care of [name]. [name]. Please call our office at [phone] if we can be of additional assistance.   [name] [name] Prince Rome, NP

## 2023-03-28 IMAGING — CT CT HEAD W/O CM
4 series · 16 of 47 positions shown, 18 images · non-contrast
Comparison: 04/09/2020

CLINICAL DATA: Mental status changes



[Series 2: head wo · axial · 0.47mm/px · z∈[-148,-18]mm · 7 of 36 slices shown, 9 images]
[im 5/36  brain]
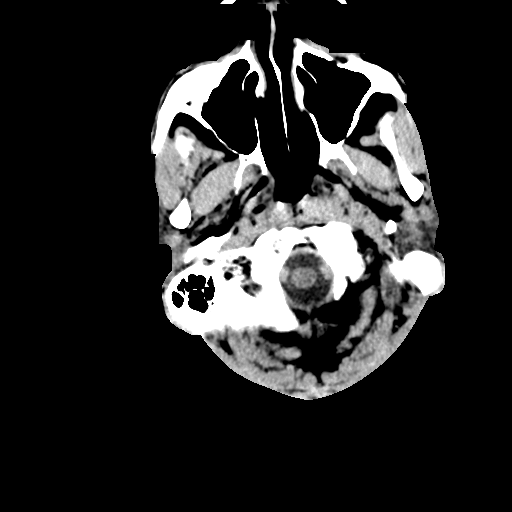
[im 5/36  bone]
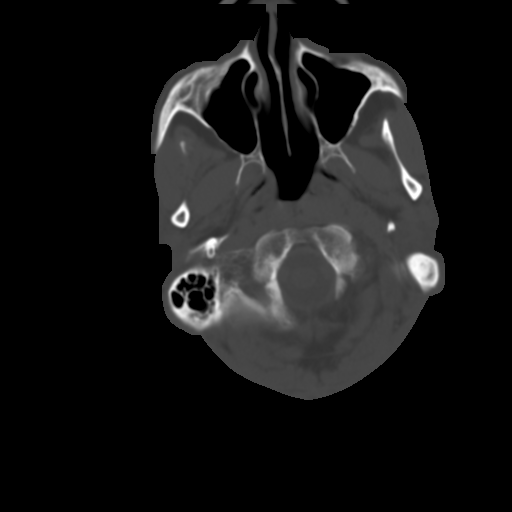
[im 9/36  brain]
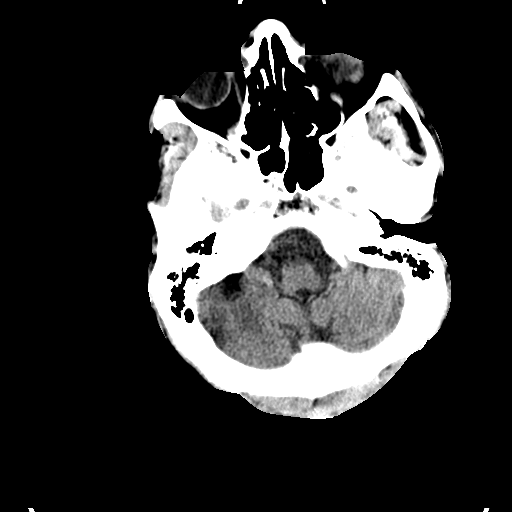
[im 14/36  brain]
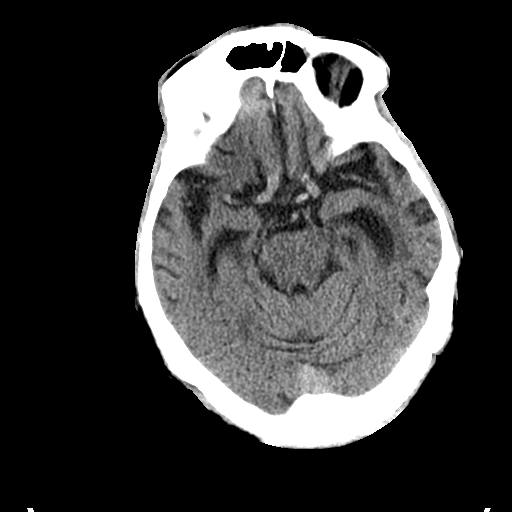
[im 18/36  brain]
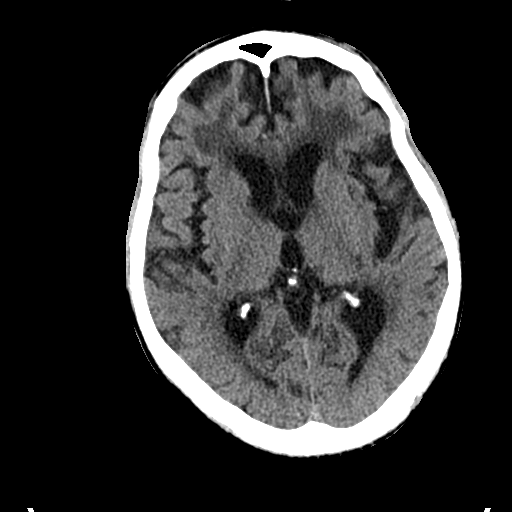
[im 22/36  brain]
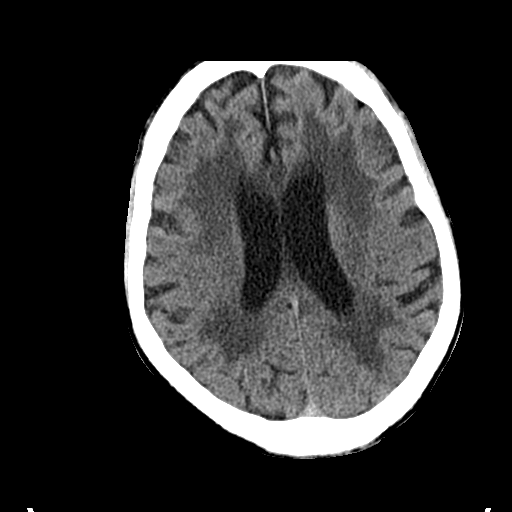
[im 22/36  bone]
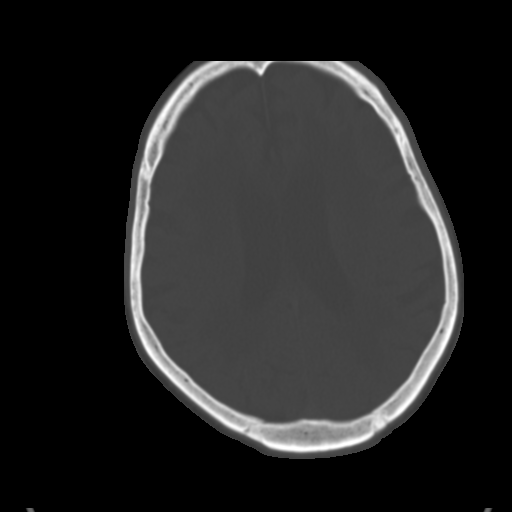
[im 27/36  brain]
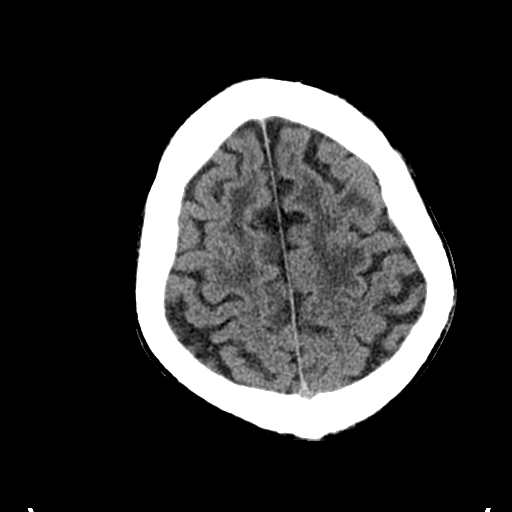
[im 31/36  brain]
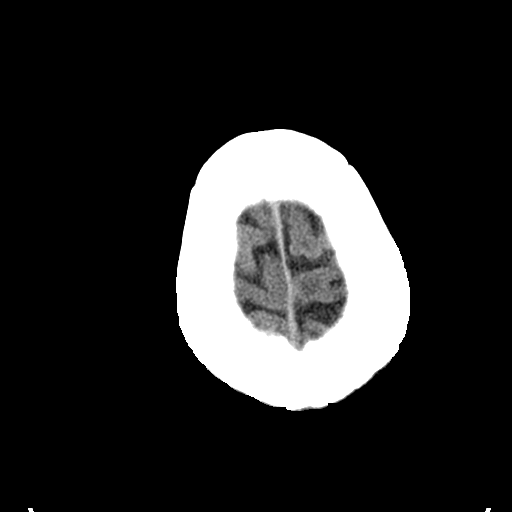

[Series 3: head bone · axial · 0.47mm/px · z∈[-152,-116]mm · 3 of 89 slices shown]
[im 9/89  bone]
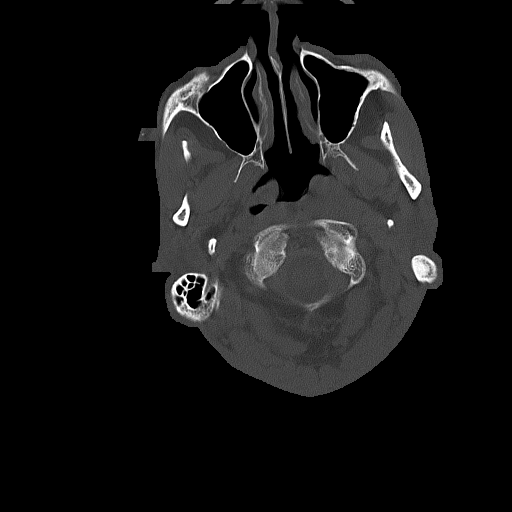
[im 18/89  bone]
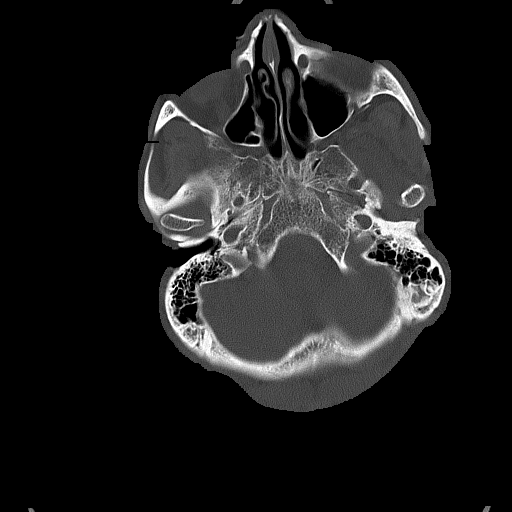
[im 27/89  bone]
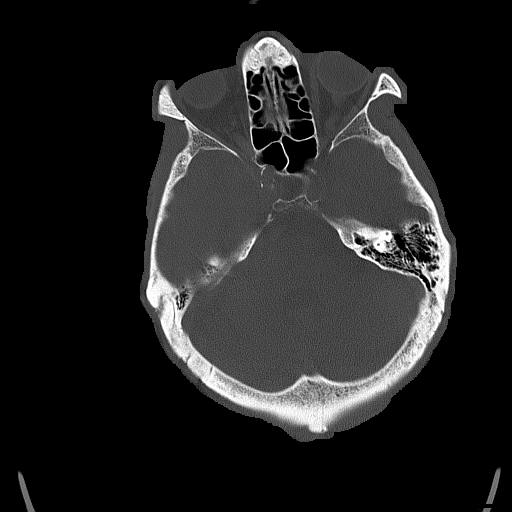

[Series 4: coronal soft tissue · coronal · 0.34mm/px · 3 of 87 slices shown]
[im 29/87  brain]
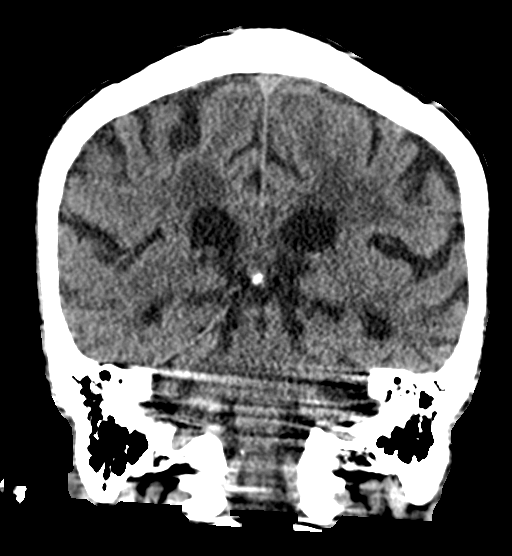
[im 39/87  brain]
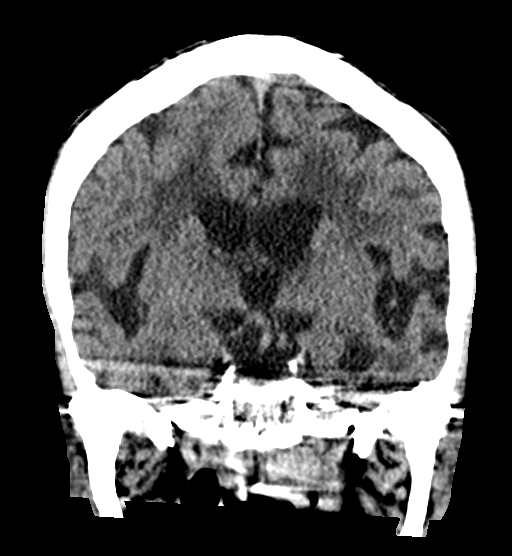
[im 48/87  brain]
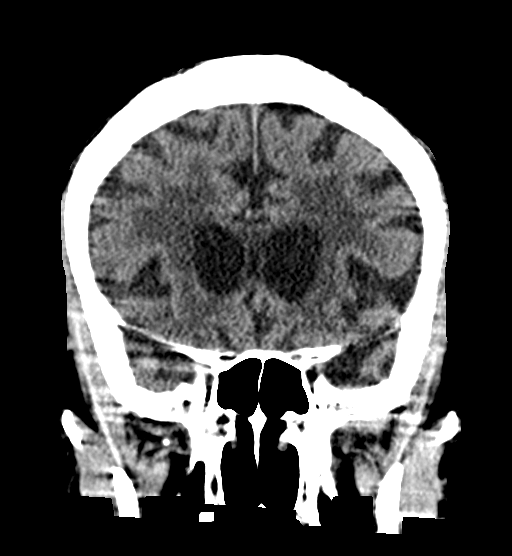

[Series 5: sagittal soft tissue · sagittal · 0.40mm/px · 3 of 59 slices shown]
[im 20/59  brain]
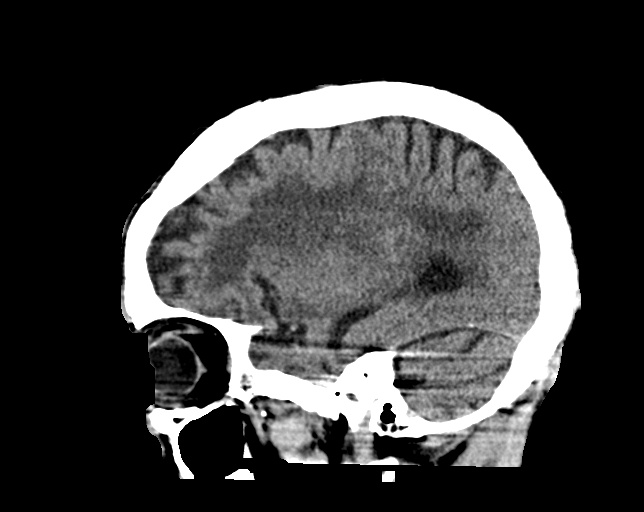
[im 30/59  brain]
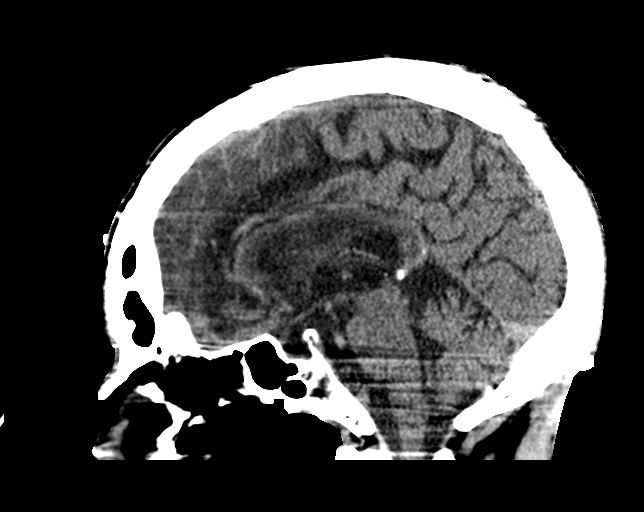
[im 39/59  brain]
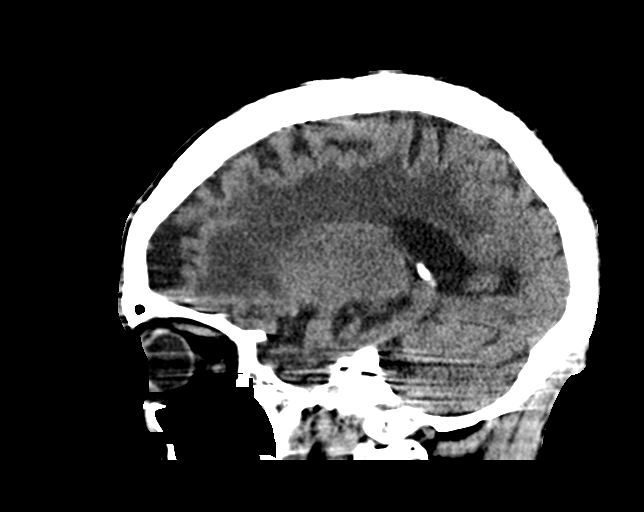

[16 of 47 positions shown; findings below may reference images not displayed]

FINDINGS: Brain: Generalized atrophy. Normal ventricular morphology. No
midline shift or mass effect. Small vessel chronic ischemic changes
of deep cerebral white matter. No intracranial hemorrhage, mass
lesion, evidence of acute infarction, or extra-axial fluid
collection.

Vascular: Mild atherosclerotic calcification of internal carotid
arteries at skull base. No hyperdense vessels.

Skull: Intact

Sinuses/Orbits: Clear.  Minimal chronic LEFT mastoid fluid noted.

Other: N/A
IMPRESSION: Atrophy with small vessel chronic ischemic changes of deep cerebral
white matter.

No acute intracranial abnormalities.

## 2023-05-21 DEATH — deceased
